# Patient Record
Sex: Male | Born: 1975 | Race: Black or African American | Hispanic: No | Marital: Married | Smoking: Never smoker
Health system: Southern US, Community
[De-identification: ages and names within clinical notes are randomized; demographics above are authoritative.]

## PROBLEM LIST (undated history)

## (undated) DIAGNOSIS — J45909 Unspecified asthma, uncomplicated: Secondary | ICD-10-CM

## (undated) HISTORY — PX: HERNIA REPAIR: SHX51

---

## 2004-08-07 ENCOUNTER — Emergency Department: Payer: Self-pay | Admitting: Emergency Medicine

## 2005-11-02 ENCOUNTER — Ambulatory Visit: Payer: Self-pay | Admitting: General Surgery

## 2006-07-21 ENCOUNTER — Ambulatory Visit: Payer: Self-pay | Admitting: Family Medicine

## 2006-07-21 IMAGING — CR DG CHEST 2V
1 series · 2 of 2 positions shown · non-contrast
Comparison: none

REASON FOR EXAM: xray chest cough wheezing hx: asthma call report
COMMENTS:

PROCEDURE:     DXR - DXR CHEST PA (OR AP) AND LATERAL  - [DATE]  [DATE]
RESULT:     PA and lateral views of the chest show the lung fields to be
clear.  The heart, mediastinal and osseous structures are normal in
appearance.

[Series 1: view not recorded · 0.17mm/px · 2 of 2 slices shown]
[im 1/2]
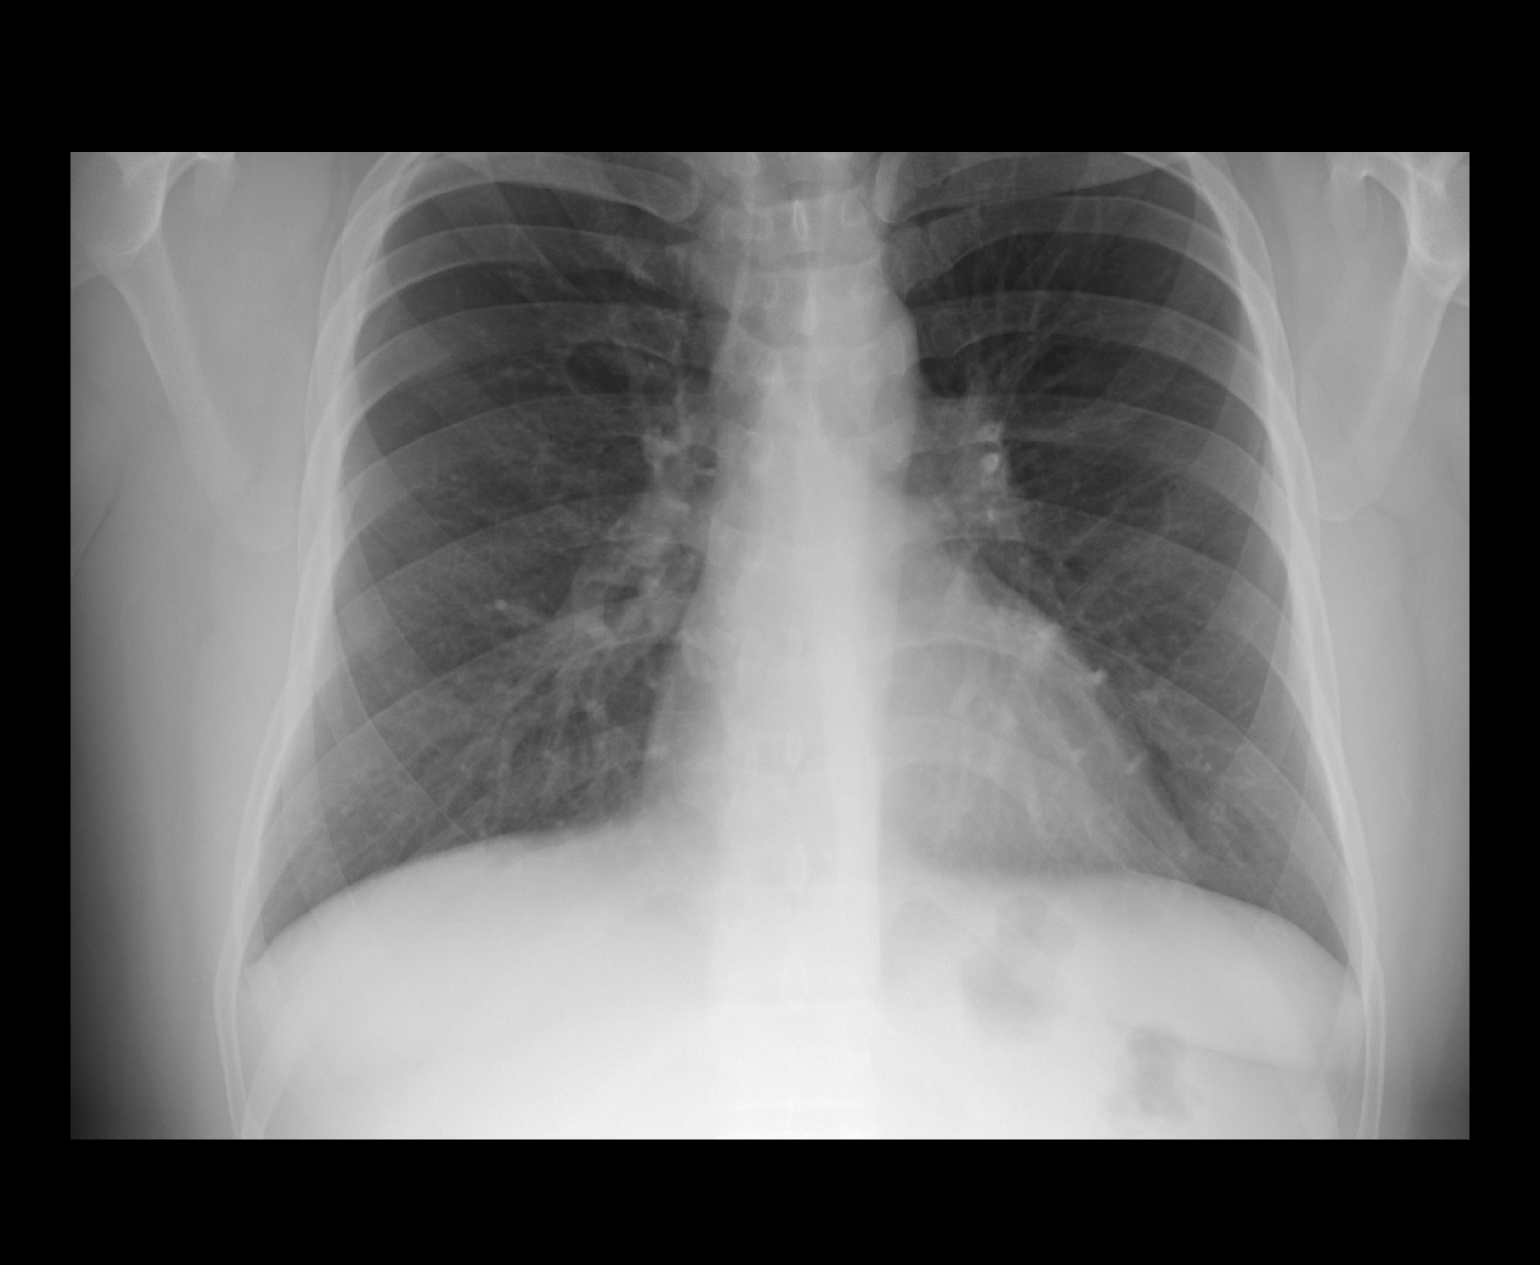
[im 2/2]
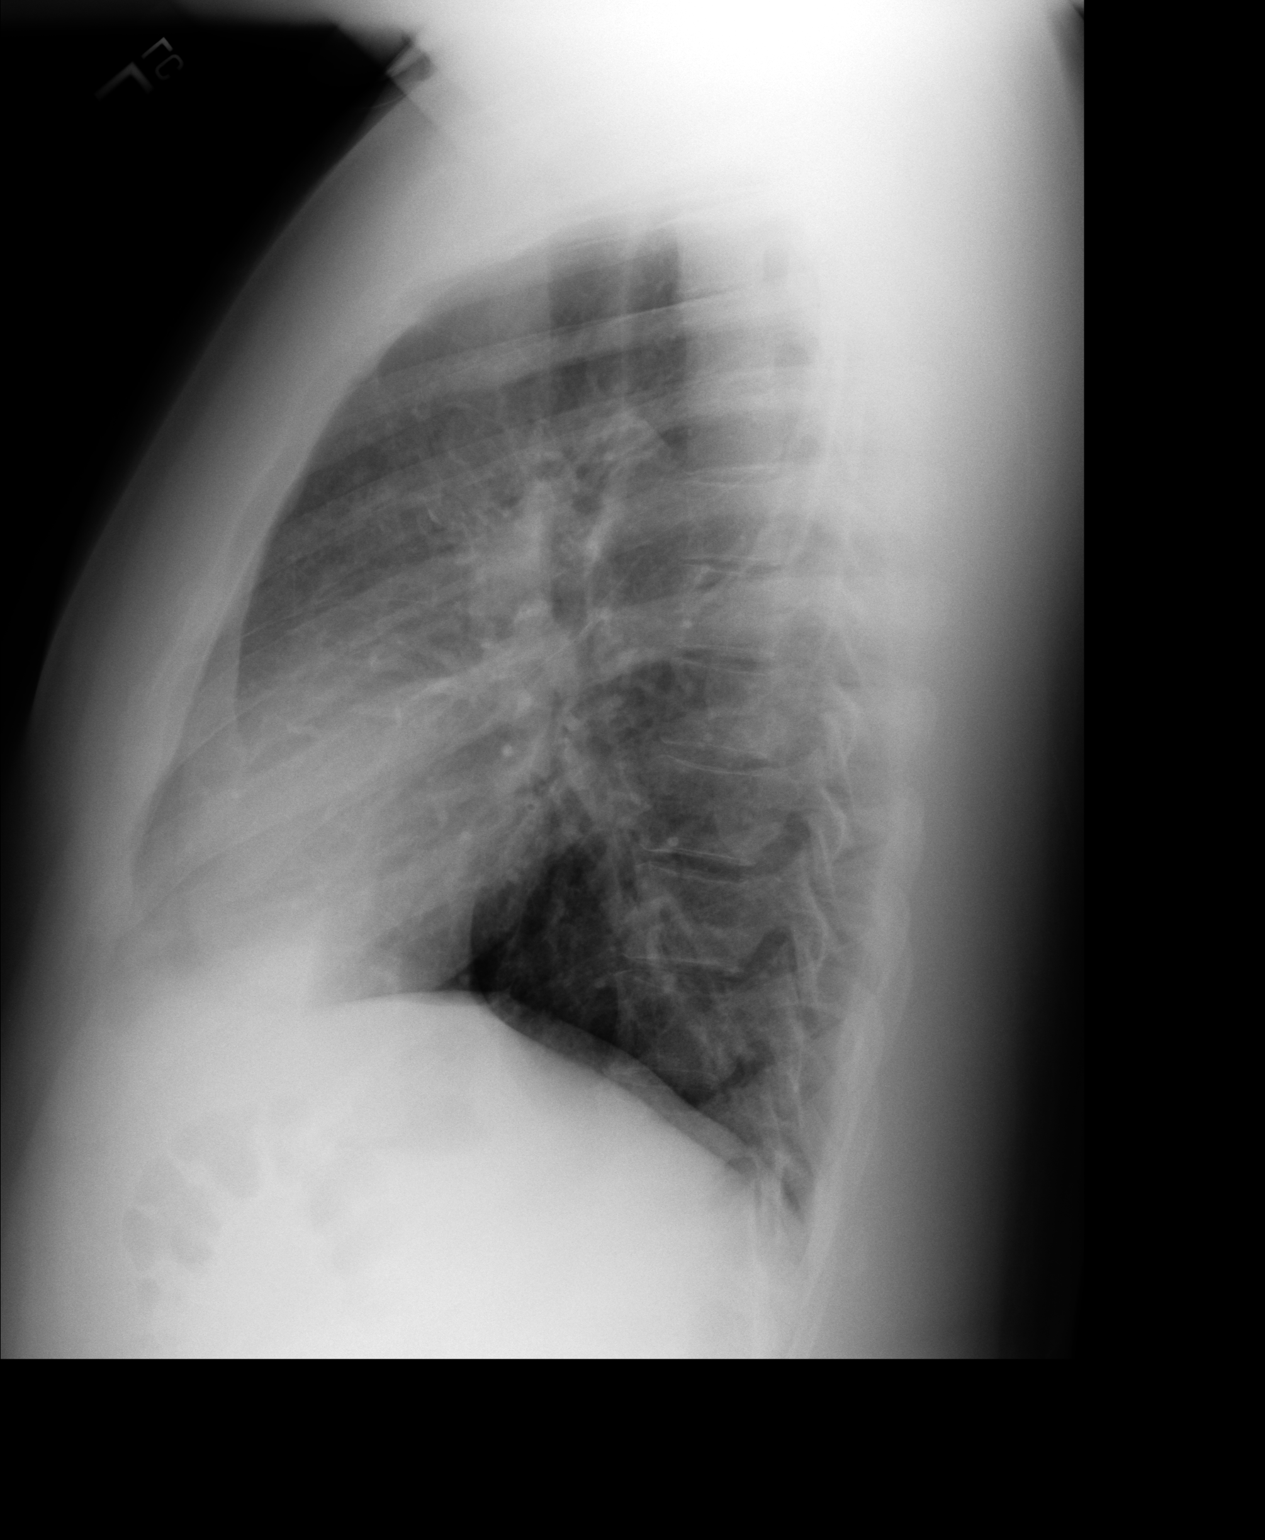

[2 of 2 positions shown; findings below may reference images not displayed]

IMPRESSION: No acute changes are identified.

## 2006-10-01 ENCOUNTER — Emergency Department: Payer: Self-pay | Admitting: Emergency Medicine

## 2008-02-13 ENCOUNTER — Emergency Department: Payer: Self-pay | Admitting: Emergency Medicine

## 2008-11-14 ENCOUNTER — Emergency Department: Payer: Self-pay | Admitting: Emergency Medicine
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
## 2010-05-22 ENCOUNTER — Emergency Department: Payer: Self-pay | Admitting: Internal Medicine

## 2012-02-25 ENCOUNTER — Inpatient Hospital Stay: Payer: Self-pay | Admitting: Internal Medicine

## 2012-02-25 LAB — BASIC METABOLIC PANEL
Anion Gap: 5 — ABNORMAL LOW (ref 7–16)
Calcium, Total: 9.1 mg/dL (ref 8.5–10.1)
Chloride: 103 mmol/L (ref 98–107)
EGFR (African American): >60
EGFR (Non-African Amer.): >60
Glucose: 132 mg/dL — ABNORMAL HIGH (ref 65–99)
Osmolality: 276 (ref 275–301)
Potassium: 3.5 mmol/L (ref 3.5–5.1)
Sodium: 138 mmol/L (ref 136–145)

## 2012-02-25 LAB — TROPONIN I: Troponin-I: <0.02 ng/mL

## 2012-02-25 LAB — CBC
HCT: 42.4 % (ref 40.0–52.0)
HGB: 13.7 g/dL (ref 13.0–18.0)
MCH: 28.2 pg (ref 26.0–34.0)
MCHC: 32.3 g/dL (ref 32.0–36.0)
Platelet: 333 10*3/uL (ref 150–440)
WBC: 13.1 10*3/uL — ABNORMAL HIGH (ref 3.8–10.6)

## 2012-02-25 IMAGING — CR DG CHEST 2V
1 series · 2 of 2 positions shown · non-contrast
Comparison: none

REASON FOR EXAM: Shortness of breath, chest pain
COMMENTS:

PROCEDURE:     DXR - DXR CHEST PA (OR AP) AND LATERAL  - [DATE]  [DATE]
RESULT:     The lungs are clear. The heart and pulmonary vessels are normal.
The bony and mediastinal structures are unremarkable. There is no effusion.
There is no pneumothorax or evidence of congestive failure.

[Series 1: w chest pa · 0.14mm/px · 2 of 2 slices shown]
[im 1/2]
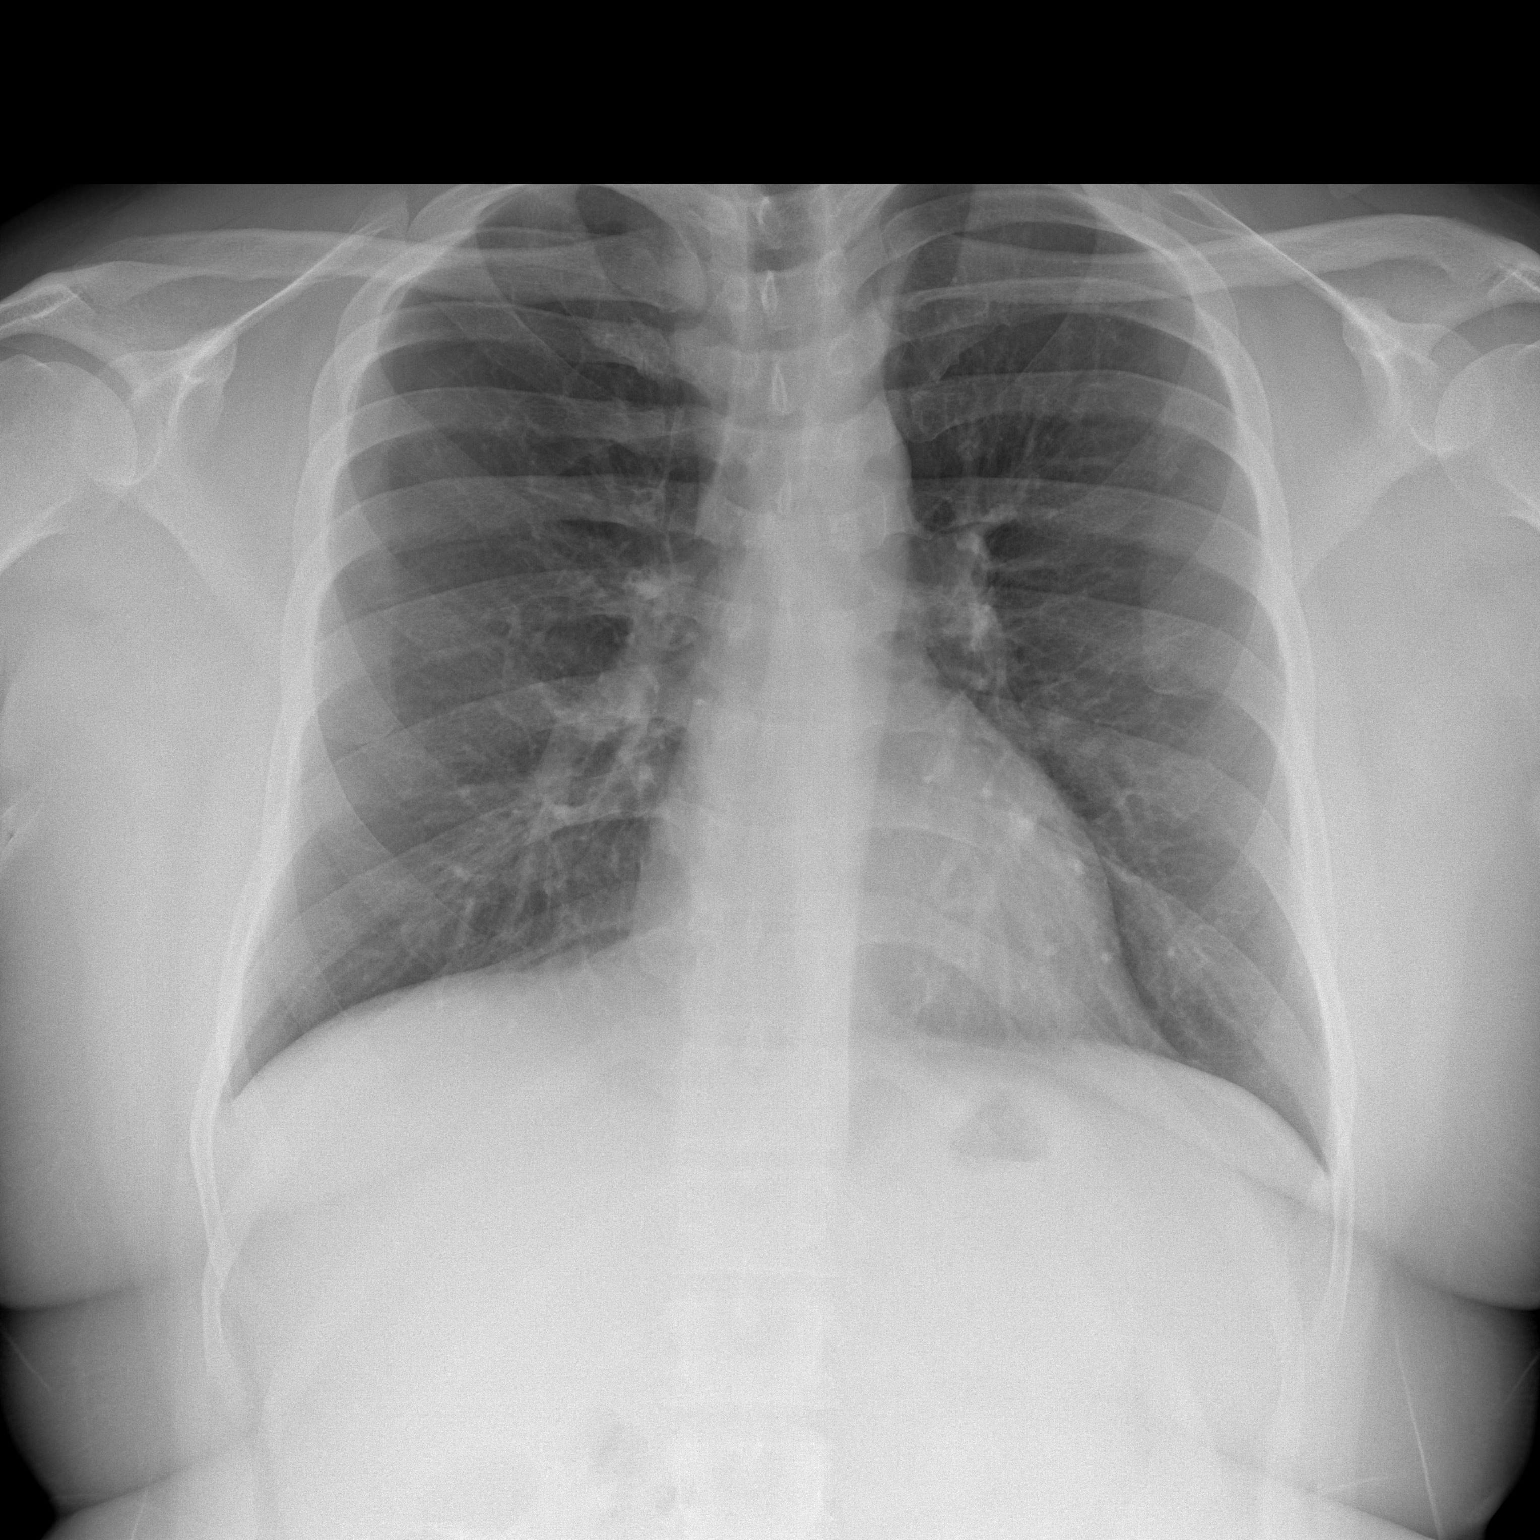
[im 2/2]
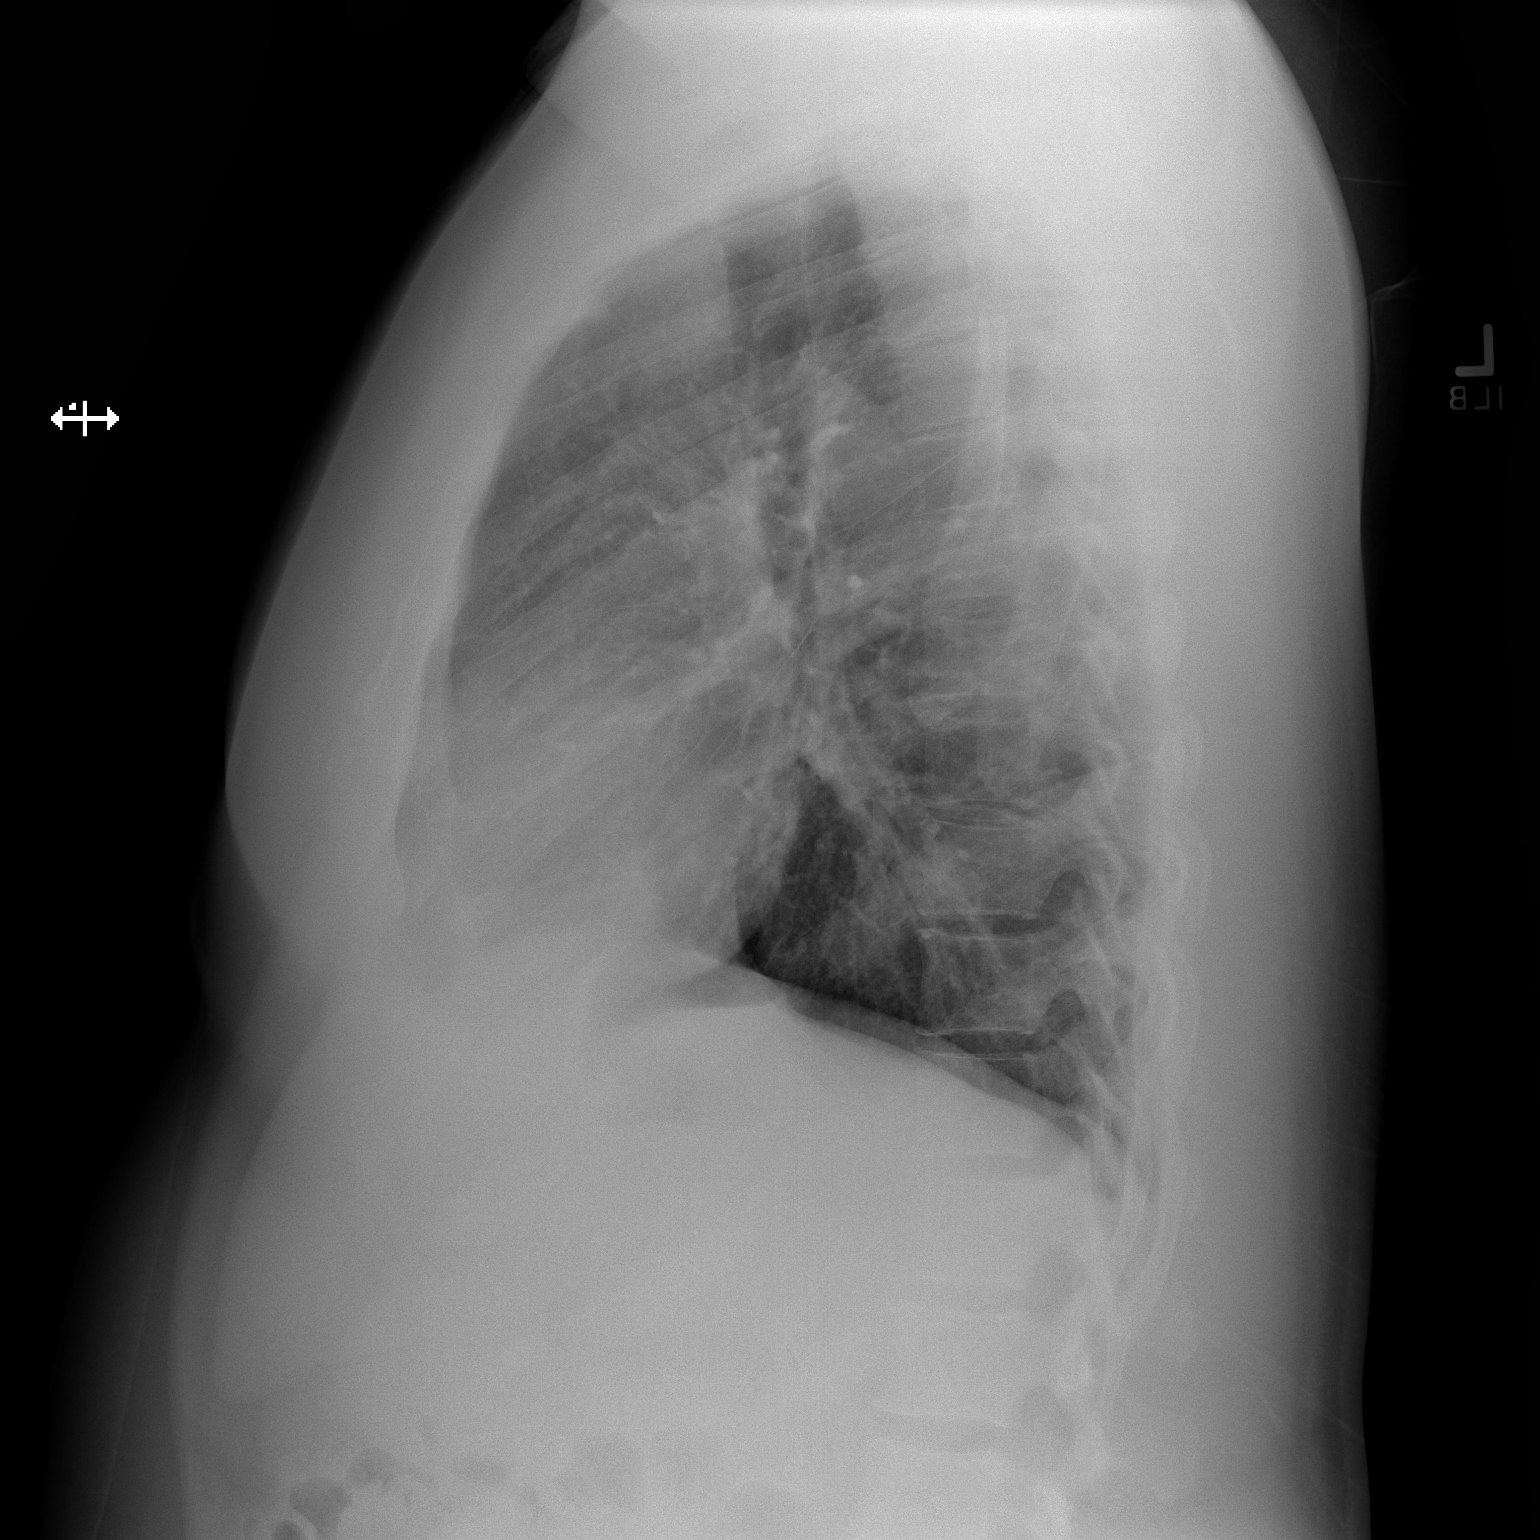

[2 of 2 positions shown; findings below may reference images not displayed]

IMPRESSION: No acute cardiopulmonary disease.

[REDACTED]

## 2012-02-26 LAB — BASIC METABOLIC PANEL
Anion Gap: 10 (ref 7–16)
BUN: 10 mg/dL (ref 7–18)
Chloride: 103 mmol/L (ref 98–107)
Creatinine: 1.16 mg/dL (ref 0.60–1.30)
EGFR (African American): >60
Glucose: 152 mg/dL — ABNORMAL HIGH (ref 65–99)
Osmolality: 280 (ref 275–301)
Sodium: 139 mmol/L (ref 136–145)

## 2012-02-26 LAB — CBC WITH DIFFERENTIAL/PLATELET
Basophil #: 0 10*3/uL (ref 0.0–0.1)
Basophil %: 0.2 %
Eosinophil #: 0 10*3/uL (ref 0.0–0.7)
HGB: 12.6 g/dL — ABNORMAL LOW (ref 13.0–18.0)
Lymphocyte %: 11.7 %
MCH: 27 pg (ref 26.0–34.0)
MCV: 87 fL (ref 80–100)
Neutrophil %: 86.9 %
Platelet: 341 10*3/uL (ref 150–440)
RBC: 4.67 10*6/uL (ref 4.40–5.90)

## 2013-03-18 ENCOUNTER — Emergency Department: Payer: Self-pay | Admitting: Emergency Medicine

## 2013-08-31 ENCOUNTER — Emergency Department: Payer: Self-pay | Admitting: Emergency Medicine

## 2013-08-31 IMAGING — CR DG KNEE COMPLETE 4+V*R*
1 series · 4 of 4 positions shown · non-contrast
Comparison: None.

CLINICAL DATA: Pain post trauma

EXAM:
RIGHT KNEE - COMPLETE 4+ VIEW

[Series 1: t knee ap right · 0.14mm/px · 4 of 4 slices shown]
[im 1/4]
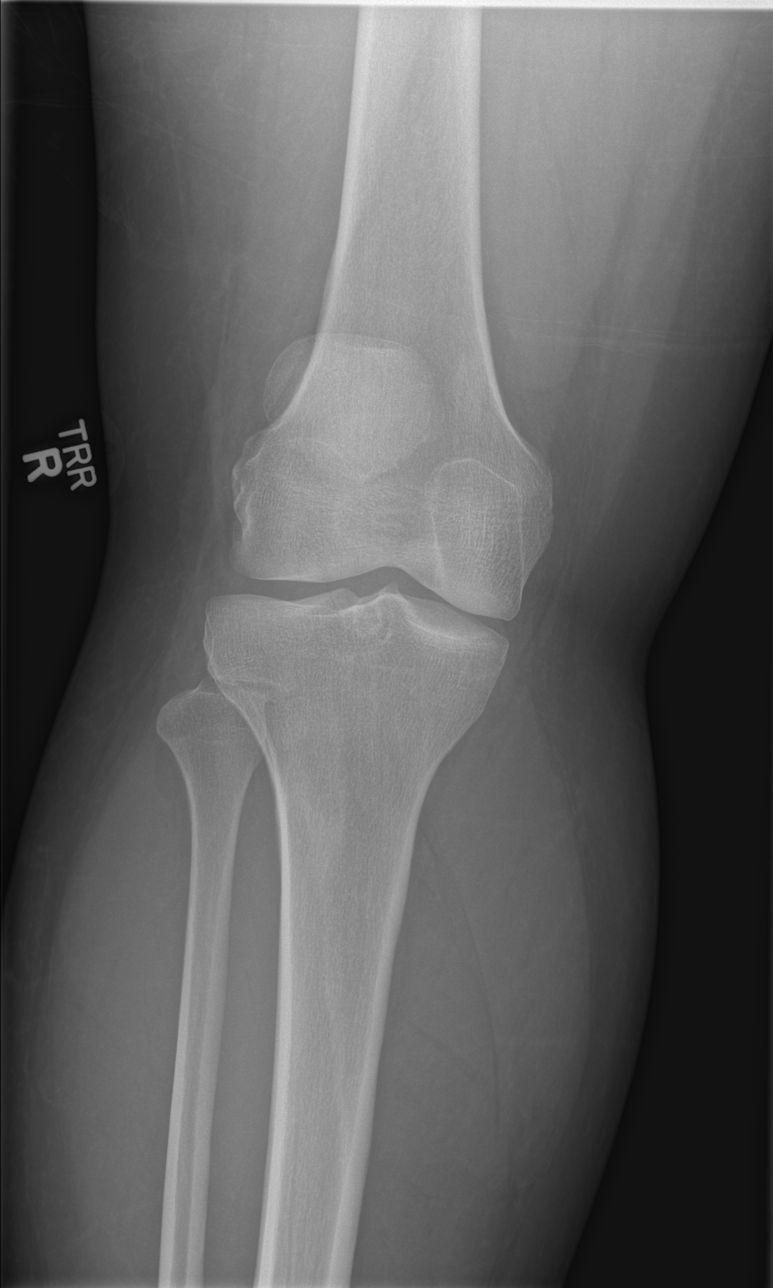
[im 2/4]
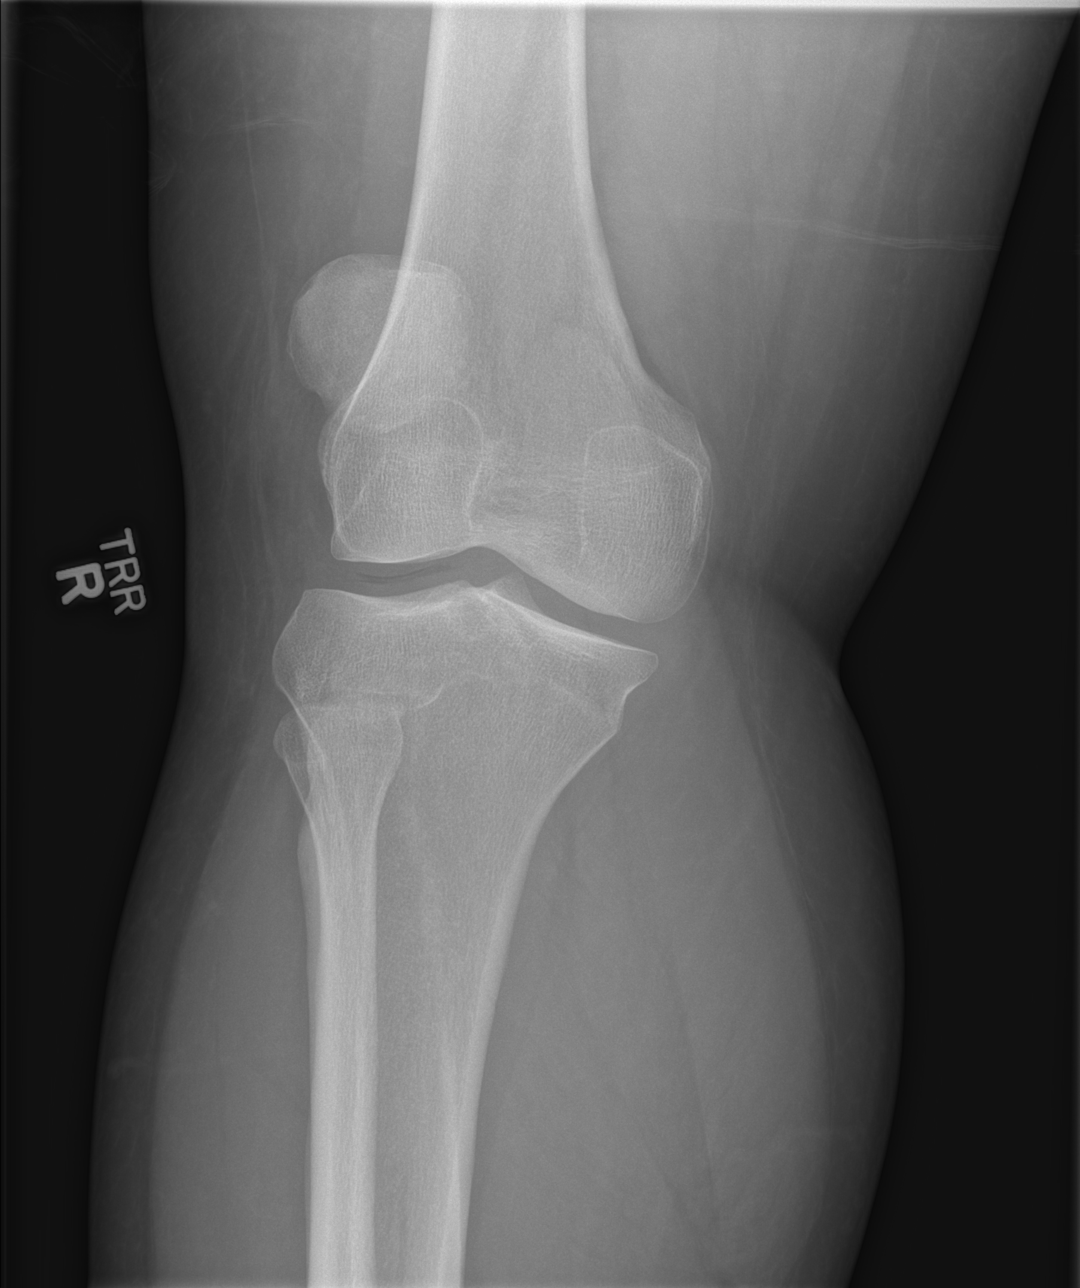
[im 3/4]
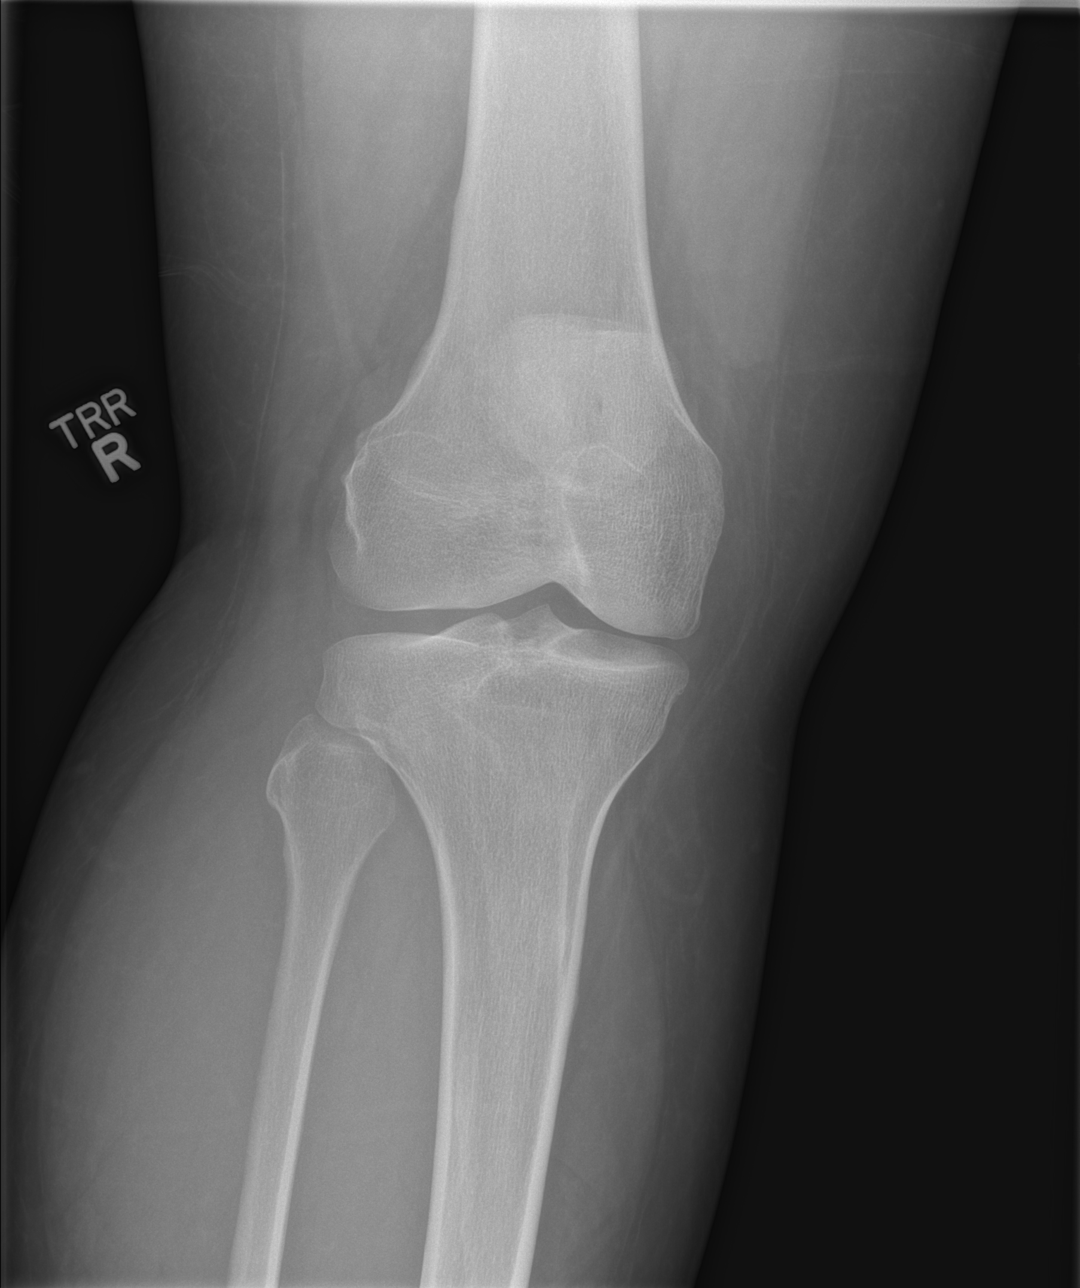
[im 4/4]
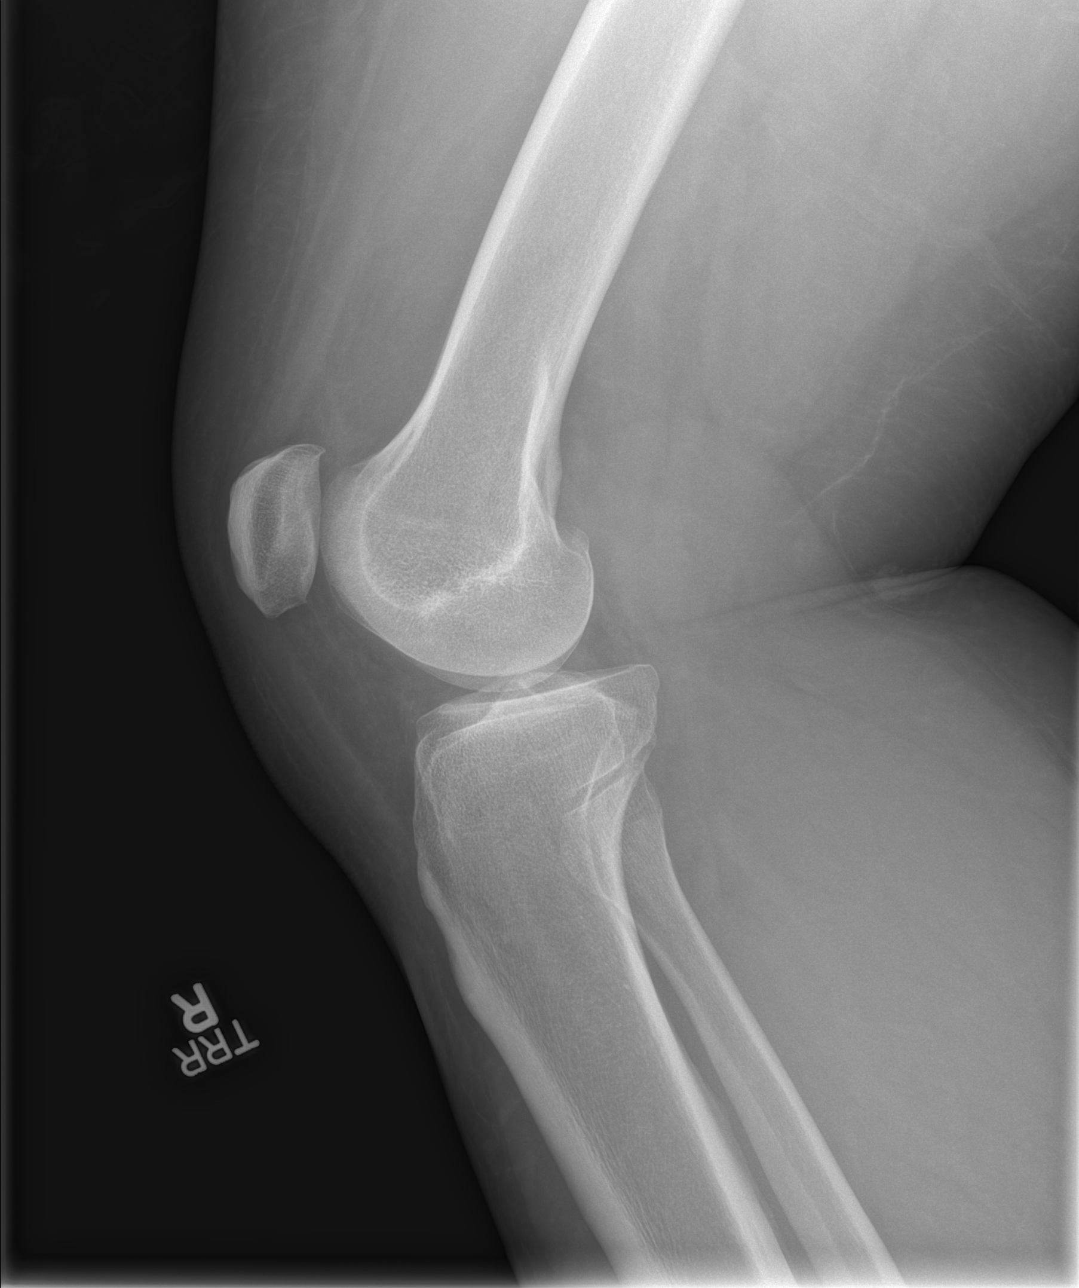

[4 of 4 positions shown; findings below may reference images not displayed]

FINDINGS: Frontal, lateral, and bilateral oblique views were obtained. There
is no fracture, dislocation, or effusion. Joint spaces appear
intact. No erosive change.
IMPRESSION: No abnormality noted.

## 2013-08-31 IMAGING — CR DG LUMBAR SPINE 2-3V
1 series · 3 of 3 positions shown · non-contrast
Comparison: None.

CLINICAL DATA: Pain post trauma

EXAM:
LUMBAR SPINE - 2-3 VIEW

[Series 1: t lumbar spine ap · 0.14mm/px · 3 of 3 slices shown]
[im 1/3]
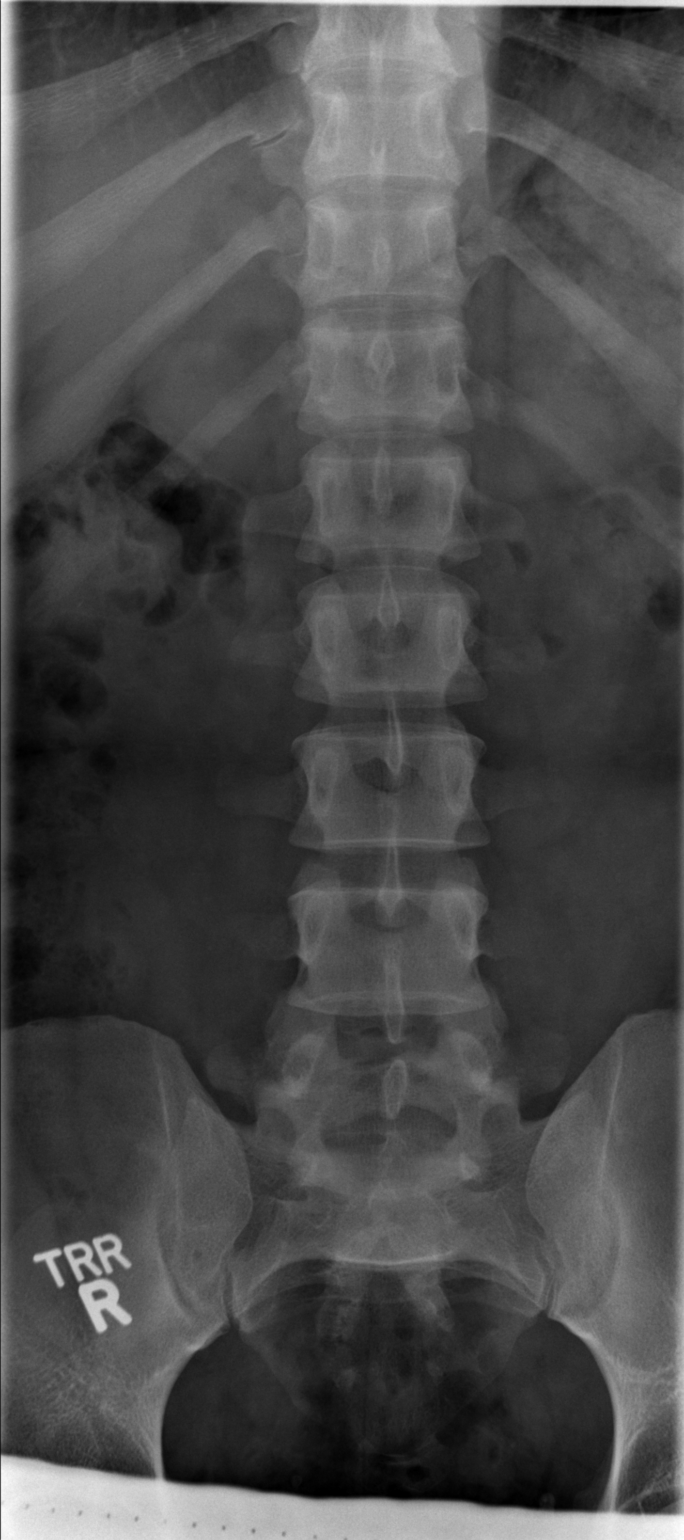
[im 2/3]
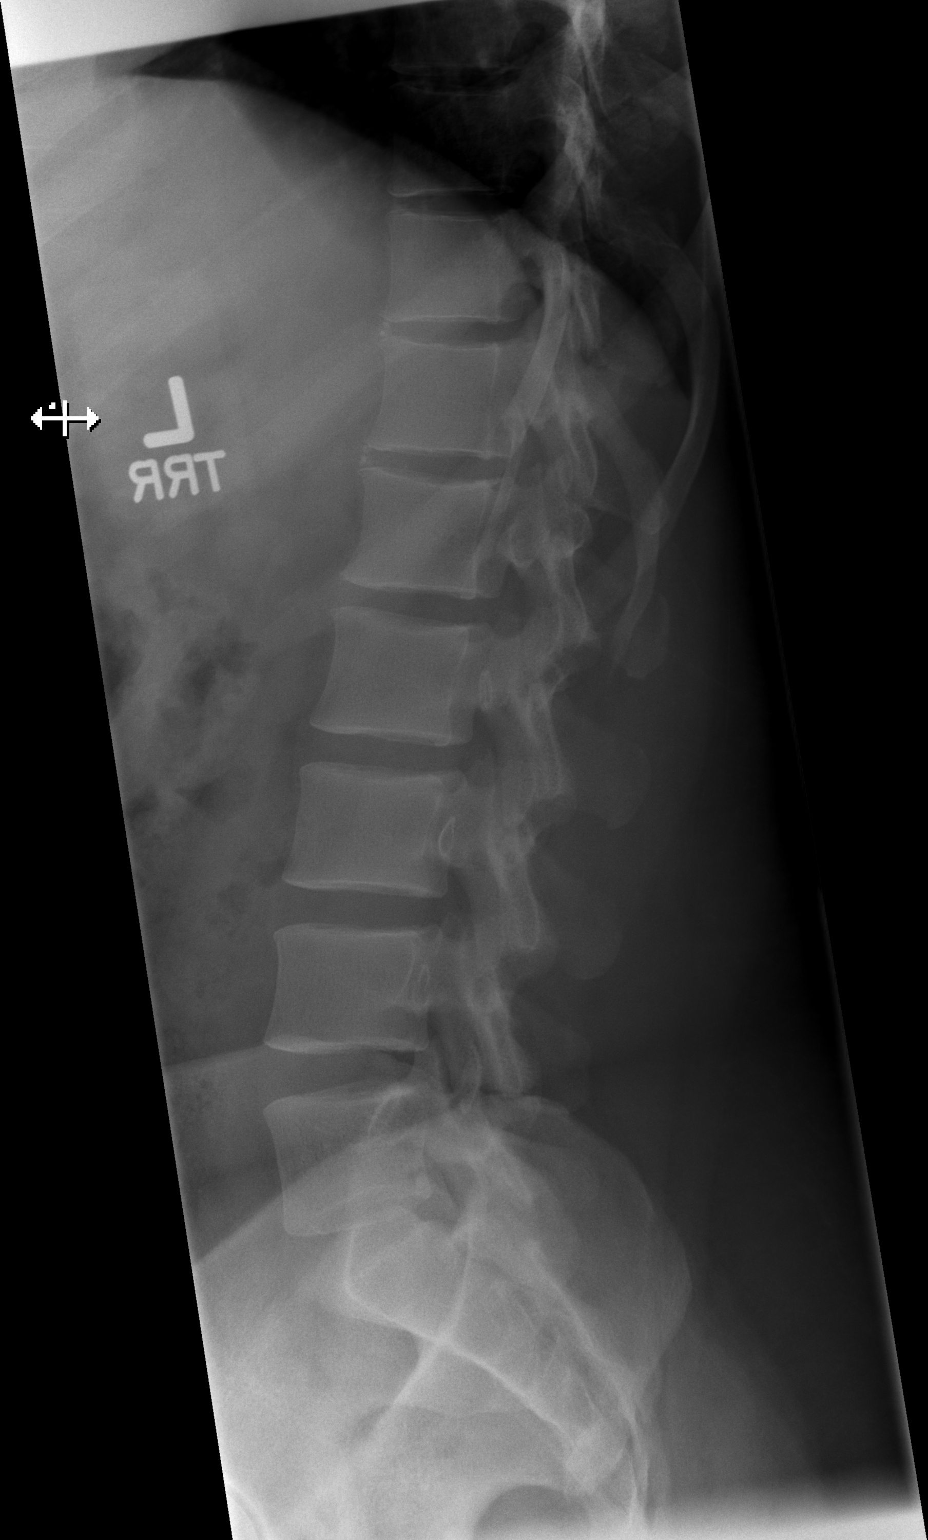
[im 3/3]
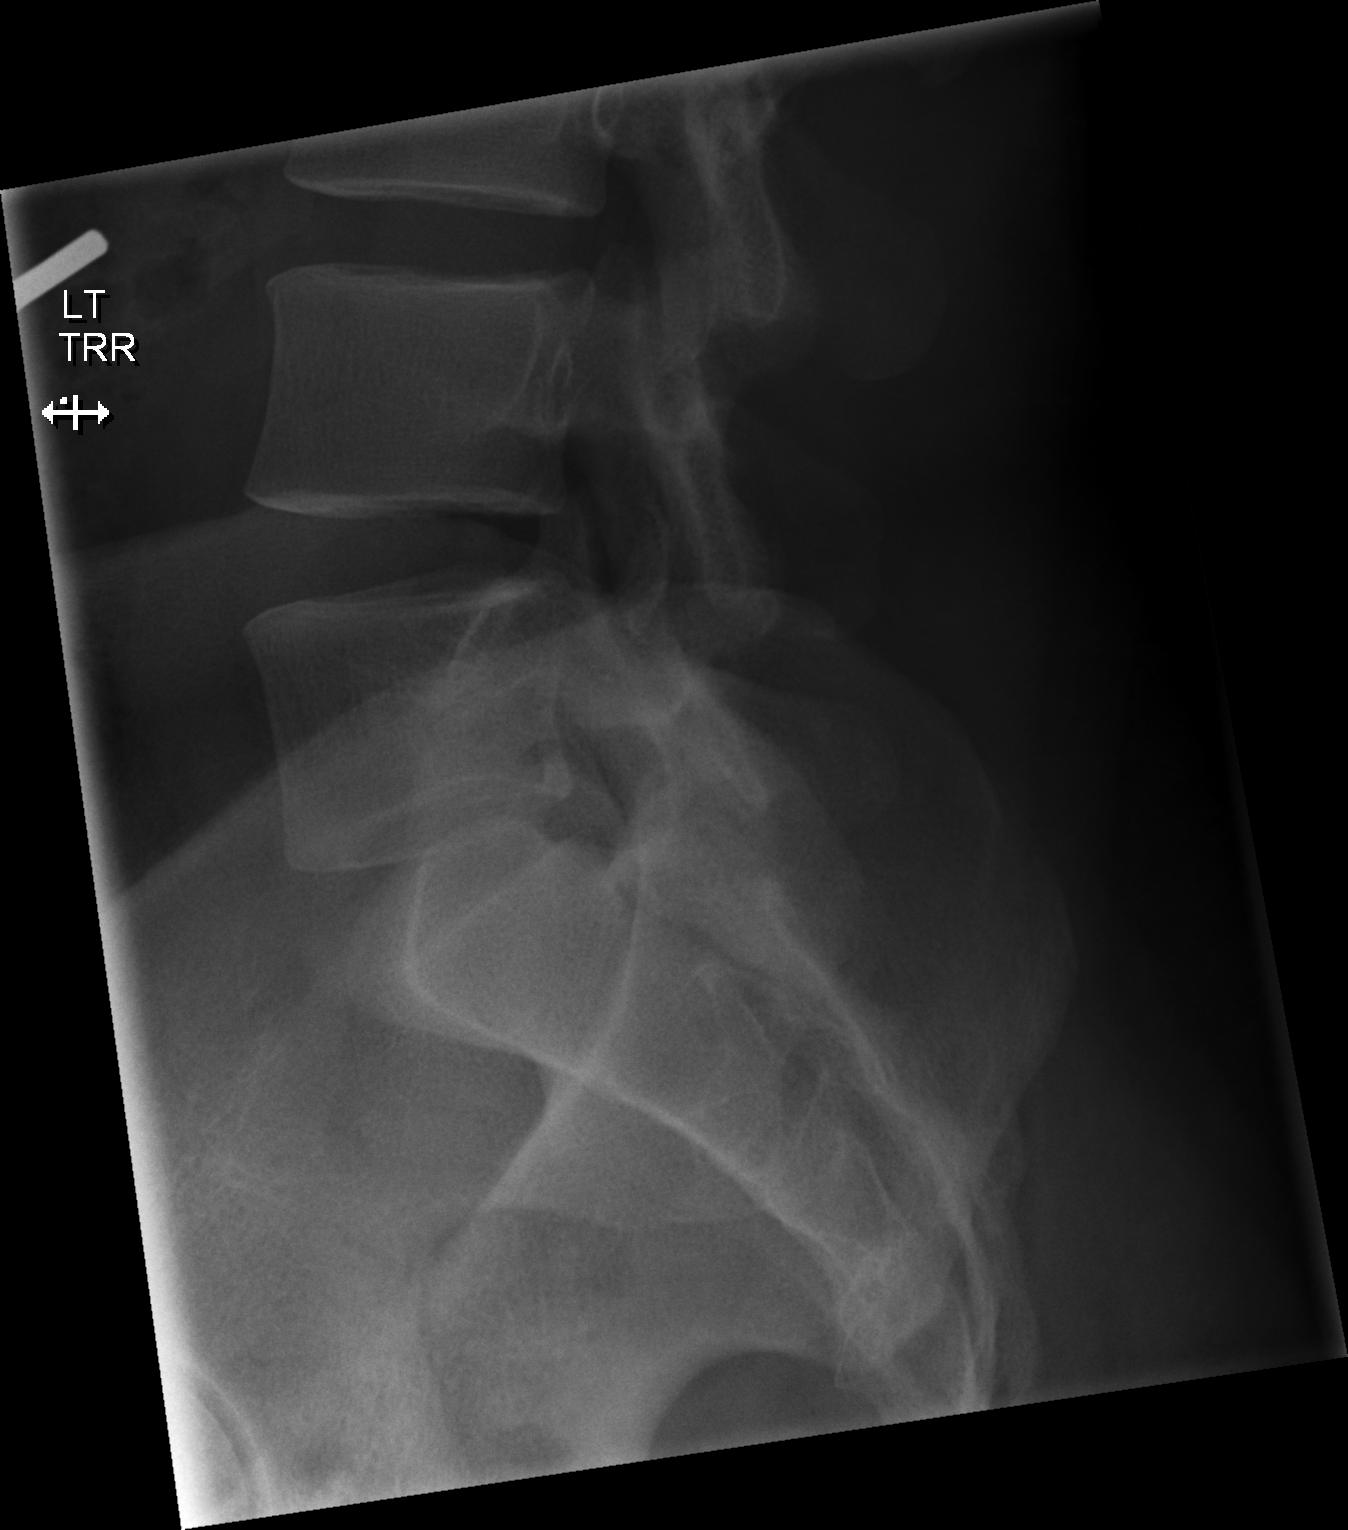

[3 of 3 positions shown; findings below may reference images not displayed]

FINDINGS: Frontal, lateral, and spot lumbosacral lateral images were obtained.
There are 5 non-rib-bearing lumbar type vertebral bodies. There is
no fracture or spondylolisthesis. Disc spaces appear intact.
IMPRESSION: No fracture or appreciable arthropathy.
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
## 2013-09-14 ENCOUNTER — Emergency Department: Payer: Self-pay | Admitting: Emergency Medicine

## 2013-12-15 ENCOUNTER — Emergency Department: Payer: Self-pay | Admitting: Emergency Medicine

## 2014-03-22 ENCOUNTER — Emergency Department: Payer: Self-pay | Admitting: Emergency Medicine

## 2014-06-08 NOTE — Discharge Summary (Signed)
PATIENT NAMHarrie Foreman:  [name], Mark [name] MR#:  [id number] DATE OF BIRTH:  1975/04/08  DATE OF ADMISSION:  02/25/2012 DATE OF DISCHARGE:  02/26/2012  ADMITTING PHYSICIAN:  Dr. Mordecai MaesSanchez.  DISCHARGING PHYSICIAN:  Dr. Enid Baasadhika Marcena Dias.   PRIMARY CARE PHYSICIAN:  None.  CONSULTATIONS IN THE HOSPITAL:  None.   DISCHARGE DIAGNOSES:  Acute asthma exacerbation.   DISCHARGE MEDICATIONS: 1.  Prednisone taper 60 mg by mouth daily and taper off by 10 mg every day.  2.  Albuterol inhaler 2 puffs 4 times a day as needed, patient already had.  3.  Albuterol nebulizer 2.5 mg/3 mL solution 3 mL q. 6 hours p.r.n. for wheezing and shortness of breath.  4.  Advair 250/50 1 puff twice daily.    DISCHARGE DIET:  Regular diet.   DISCHARGE ACTIVITY:  As tolerated.   FOLLOWUP INSTRUCTIONS:  PCP follow-up in 3 to 4 weeks.  His appointment is being set up with [facility] for 03/22/2012 at 5:00 p.m.   BRIEF HOSPITAL COURSE:  The patient is a 39 year old African American male with known history of asthma, admitted for acute asthma exacerbation.    Acute asthma exacerbation.  The patient has been doing well at home with no recent exacerbation or flare-ups or recent hospitalizations or being on steroids.  He just has a nebulizer machine at home and uses it as needed.  He thinks probably the weather change is what brought this episode on.  He tried using his nebulizer without any success so came to the hospital.  He was diffusely wheezing on admission, was placed on IV steroids and also an Advair inhaler.  His lungs are clear now and he is ambulating well on room air and not appears to be in respiratory distress.  So, he is being tapered to oral steroids, albuterol inhaler and Advair to be taken at home.  Care management is helping with his medications.  His course has been otherwise uneventful in the hospital.   DISCHARGE CONDITION:  Stable.   DISCHARGE DISPOSITION:  Home.   TIME SPENT ON DISCHARGE:  Forty-five minutes.      ____________________________ Enid Baasadhika Jerri Hargadon, MD rk:ea [name]: 02/26/2012 13:49:55 ET T: 02/27/2012 06:40:59 ET JOB#: [id number]  cc: Enid Baasadhika Bita Cartwright, MD, <Dictator> [facility] Enid BaasADHIKA Marci Polito MD ELECTRONICALLY SIGNED 02/29/2012 15:20

## 2014-06-08 NOTE — H&P (Signed)
PATIENT NAMHarrie [name]:  [name], [name] MR#:  [id number] DATE OF BIRTH:  [birth year]  DATE OF ADMISSION:  02/25/2012  CHIEF COMPLAINT:  Shortness of breath and wheezing.   PRIMARY CARE PHYSICIAN:  The patient does not know his name. He has not seen him in over a year.   REFERRING PHYSICIAN:  Dorothea GlassmanPaul Malinda, MD  HISTORY OF PRESENT ILLNESS:  The patient is a very nice 39 year old gentleman with a history of severe asthma. The patient usually uses his inhaler and nebulizers quite often several times a week and recently with the change in the weather he became really short of breath and this morning around 3 a.m. started wheezing really hard and started using nebulizer treatments pretty much every 2 to 3 hours without any significant improvement for which he came to the ER to get evaluated. Here in the ER, she got 60 mg of prednisone and at least 3 or 4 nebulizer treatments without any significant improvement. He is not wheezing at this moment, but he is not moving much air. He is on room air, but does not seem to be able to tolerate much activity without getting short of breath. The patient has been previously intubated once at the age of 39 and has been in the hospital on multiple occasions due to his asthma exacerbation. He denies any upper respiratory infection. There are no sick contacts at home that he can tell. He denies any significant infection lately. He states that whenever the weather changes drastically to very cold, the patient gets his exacerbations this way as well. It happens whenever he is really stressed and he has been really stressed since he was laid off work in February.   REVIEW OF SYSTEMS:   CONSTITUTIONAL: No fever. No fatigue. No weakness. No weight loss or weight gain.  EYES: No blurry vision. No double vision. No cataracts.  ENT: No tinnitus. No difficulty swallowing. No nasal dripping. No signs of upper respiratory infection.  RESPIRATORY: No tuberculosis. No pneumonia. He has been  coughing, wheezing and having significant dyspnea especially with minimal exertion for the past day.  CARDIOVASCULAR: No chest pain. No orthopnea. No syncope.  GASTROINTESTINAL: No nausea, vomiting, abdominal pain, constipation or diarrhea.  GENITOURINARY: No dysuria, hematuria or changes in frequency.  ENDOCRINE: No polyuria, polydipsia or polyphagia. No cold or heat intolerance.  HEMATOLOGIC/LYMPHATICS: No anemia, easy bruising or swollen glands.  MUSCULOSKELETAL: No significant back pain, shoulder pain, knee pain or gout. No swelling of joints.  NEUROLOGIC: No numbness. No tingling. No significant weakness or ataxia.  PSYCHIATRIC: The patient is stressed out due to his job situation. Does not have any significant depression or anxiety.   PAST MEDICAL HISTORY:  History of asthma.   ALLERGIES:  No known drug allergies.   PAST SURGICAL HISTORY:  The patient has had an inguinal hernia repair.   MEDICATIONS:  ProAir and nebulizer at home.   SOCIAL HISTORY:  The patient has been out of work since February. 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 He does not smoke and does not drink. He has 3 children and his wife lives with him.   FAMILY HISTORY:  His mother and his sister have severe asthma. His grandmother had diabetes, hypertension and coronary artery disease.   PHYSICAL EXAMINATION: VITAL SIGNS: Blood pressure 148/62, pulse 89, respiratory rate 20, temperature 98.3.  GENERAL: The patient is alert and oriented x 3 in no acute distress and very pleasant. No respiratory distress. Hemodynamically stable.  HEENT: Pupils are equal and reactive. Extraocular movements are intact. Mucosa is moist. No oral lesions. No oropharyngeal exudates. No thrush.  NECK: Supple. No JVD. No thyromegaly. No adenopathy. No carotid bruits. No rigidity.  CARDIOVASCULAR: Regular rate and rhythm. No murmurs, rubs, or gallops.  PULMONARY: At this moment, he is not wheezing. He has mild  rhonchi. He is not moving much air. No good expansion of the bilateral lung fields. No dullness to percussion. No use of accessory muscles.   ABDOMEN: Soft, nontender, nondistended. No hepatosplenomegaly. No masses. Bowel sounds are positive.  GENITAL: Deferred.  EXTREMITIES: No edema, no cyanosis, no clubbing. Pulses +2.  NEUROLOGIC: Cranial nerves II through XII are intact. No focal findings.  PSYCHIATRIC: No anxiety or evident agitation.  SKIN: Without any rashes or petechiae. No signs of eczema.  MUSCULOSKELETAL: No significant joint abnormality or joint effusions.  LYMPHATIC: Negative for lymphadenopathy in the neck or supraclavicular areas.   DIAGNOSTIC DATA:  Glucose is 132, creatinine 1.18, CO2 of 30, total CK 460, troponin 0.02. White count 13,000, hemoglobin is 13.7, platelets 333. Chest x-ray: No significant exudates or signs of pneumonia. No CHF.   ASSESSMENT AND PLAN:  A 39 year old gentleman with history of asthma which has been very severe to the point that he has been intubated once at the age of 72. He comes with an asthma exacerbation.  1.  Acute asthma exacerbation. The patient has triggered his symptoms due to changes in the weather. The patient states that he is not having any significant infectious symptoms like a viral or bacterial infection. No significant sputum. No significant rhinorrhea or sore throat. No fevers. No sick contacts. The patient is admitted for treatment of his exacerbation due to failure of outpatient treatment. The patient had prednisone and several nebulizers given to him here without any success. The patient is still not moving enough air. His wheezing has improved, though. The patient is going to be put on intravenous steroids at high dose and monitor his improvement. He is going to be on nebulizers. He is not able to afford his medications for which we are going to give him his inhalers. We are going to add on Advair since the patient needs it and I am  going to add on also Singulair.  2.  I am going to check his magnesium and if his magnesium is low or normal low, we are going to give him 2 grams.  3.  The patient is a full code.  4.  He does not have any other medical problems. His white count is slightly elevated at 13,000, but there is no evidence of being infection at this moment for which we are just going to observe and treat if necessary. No antibiotics were started at this moment.  5.  I discussed the case with the patient and his sister.   TIME SPENT:  I spent about 35 minutes with this admission.    ____________________________ Felipa Furnace, MD rsg:si D: 02/25/2012 21:48:00 ET T: 02/25/2012 23:40:53 ET JOB#: [id number]  cc: Felipa Furnace, MD, <Dictator> Treyvion Durkee Juanda Chance MD ELECTRONICALLY SIGNED 02/26/2012 13:51

## 2014-07-19 ENCOUNTER — Emergency Department
Admission: EM | Admit: 2014-07-19 | Discharge: 2014-07-19 | Disposition: A | Discharge status: Home / Self Care | Payer: Self-pay | Attending: Emergency Medicine | Admitting: Emergency Medicine

## 2014-07-19 ENCOUNTER — Encounter: Payer: Self-pay | Admitting: Emergency Medicine

## 2014-07-19 ENCOUNTER — Emergency Department: Payer: Self-pay

## 2014-07-19 DIAGNOSIS — R062 Wheezing: Secondary | ICD-10-CM

## 2014-07-19 DIAGNOSIS — J45901 Unspecified asthma with (acute) exacerbation: Secondary | ICD-10-CM | POA: Insufficient documentation

## 2014-07-19 DIAGNOSIS — J4521 Mild intermittent asthma with (acute) exacerbation: Secondary | ICD-10-CM

## 2014-07-19 HISTORY — DX: Unspecified asthma, uncomplicated: J45.909

## 2014-07-19 IMAGING — DX DG CHEST 2V
2 series · 2 of 2 positions shown · non-contrast
Comparison: [DATE]

CLINICAL DATA: Wheezing

EXAM:
CHEST  2 VIEW

[chest pa]
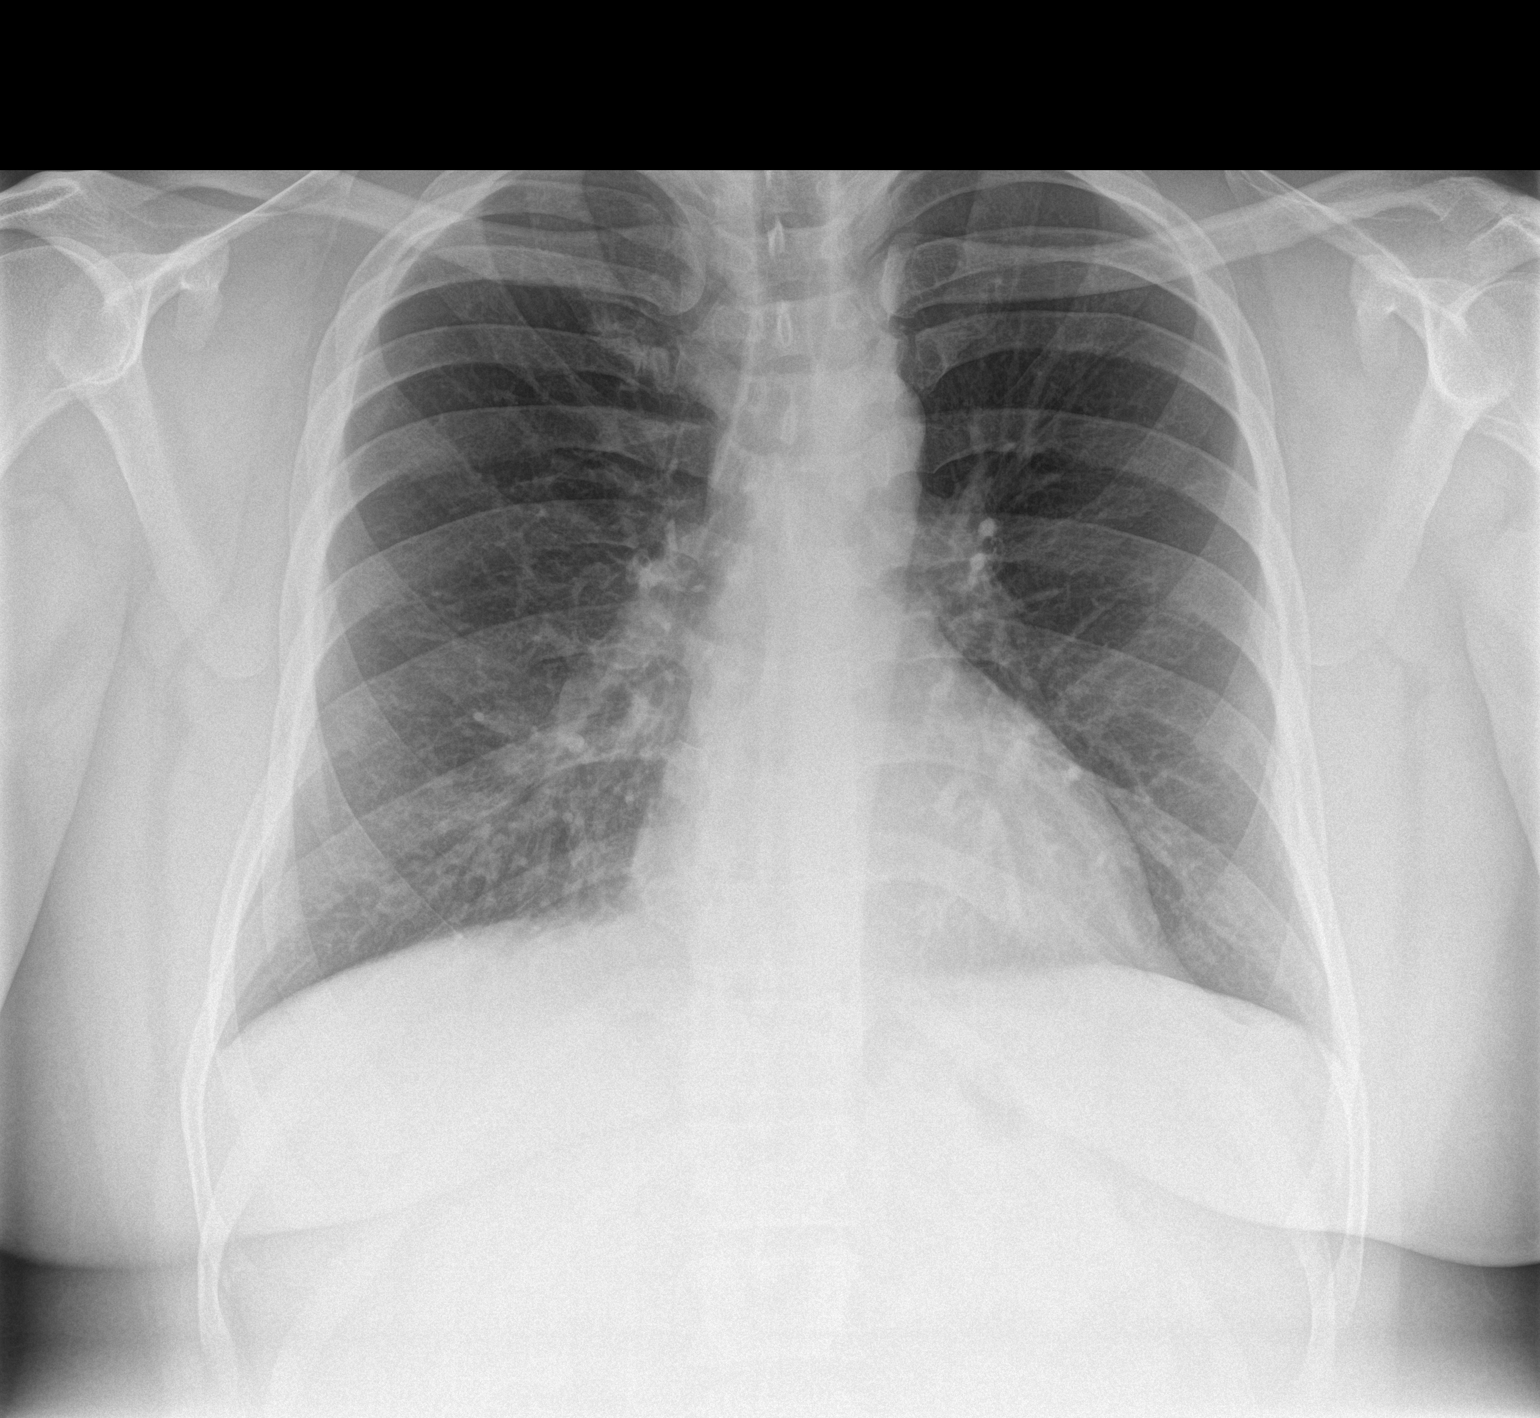

[chest lat]
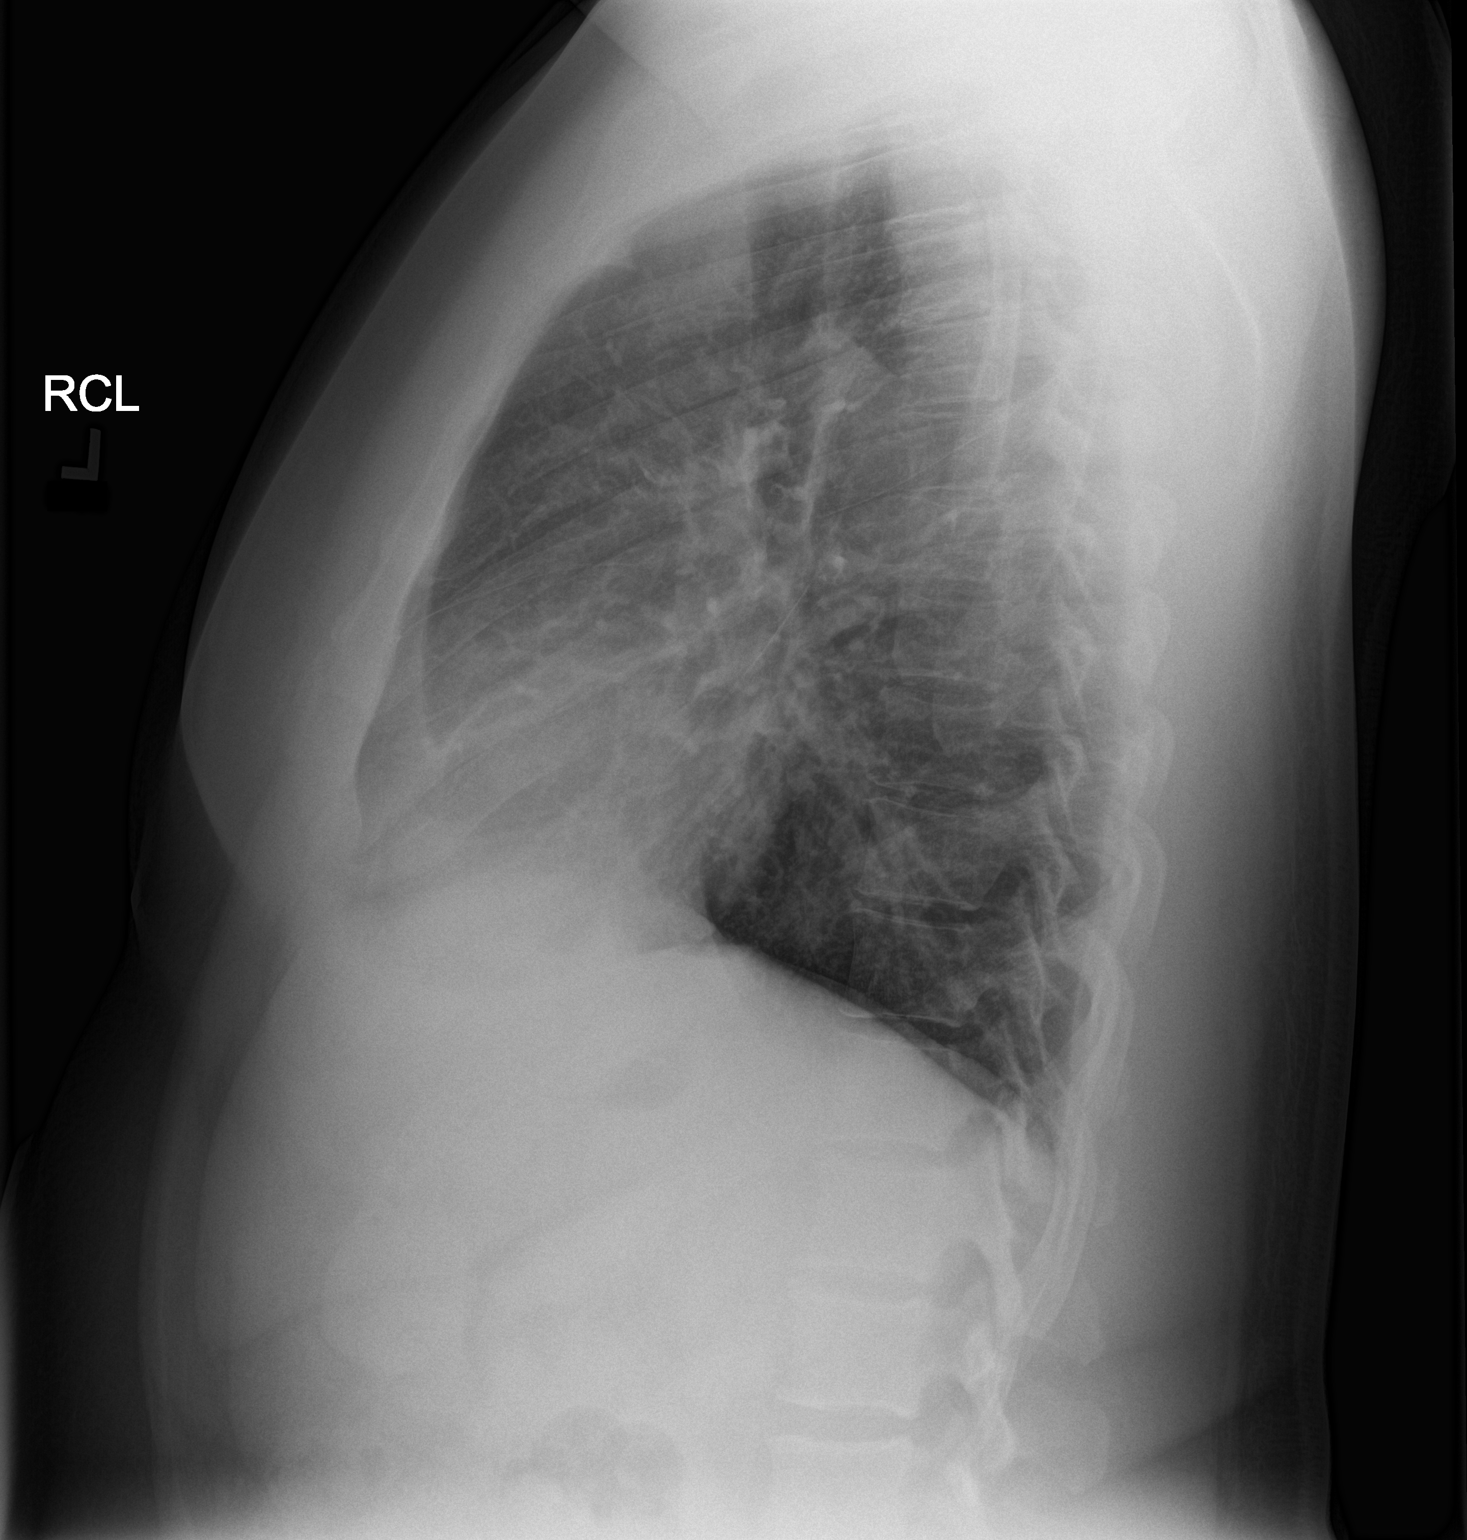

[2 of 2 positions shown; findings below may reference images not displayed]

FINDINGS: Cardiac shadow is within normal limits. The lungs are clear but
demonstrates some mild interstitial changes likely of a chronic
nature. No focal confluent infiltrate is seen no bony abnormality is
noted.
IMPRESSION: No active cardiopulmonary disease.

## 2014-07-19 MED ORDER — ALBUTEROL SULFATE (2.5 MG/3ML) 0.083% IN NEBU
2.5000 mg | INHALATION_SOLUTION | Freq: Once | RESPIRATORY_TRACT | Status: AC
  Administered 2014-07-19: 2.5 mg via RESPIRATORY_TRACT

## 2014-07-19 MED ORDER — PREDNISONE 10 MG PO TABS: ORAL_TABLET | ORAL | Status: DC

## 2014-07-19 MED ORDER — ALBUTEROL SULFATE HFA 108 (90 BASE) MCG/ACT IN AERS: 2.0000 | INHALATION_SPRAY | Freq: Four times a day (QID) | RESPIRATORY_TRACT | Status: DC | PRN

## 2014-07-19 MED ORDER — AZITHROMYCIN 250 MG PO TABS: ORAL_TABLET | ORAL | Status: AC

## 2014-07-19 MED ORDER — METHYLPREDNISOLONE SODIUM SUCC 125 MG IJ SOLR
125.0000 mg | Freq: Once | INTRAMUSCULAR | Status: AC
  Administered 2014-07-19: 125 mg via INTRAMUSCULAR

## 2014-07-19 MED ORDER — METHYLPREDNISOLONE SODIUM SUCC 125 MG IJ SOLR
INTRAMUSCULAR | Status: AC
  Administered 2014-07-19: 125 mg via INTRAMUSCULAR
  Filled 2014-07-19: qty 2

## 2014-07-19 MED ORDER — ALBUTEROL SULFATE (2.5 MG/3ML) 0.083% IN NEBU
INHALATION_SOLUTION | RESPIRATORY_TRACT | Status: AC
  Administered 2014-07-19: 2.5 mg via RESPIRATORY_TRACT
  Filled 2014-07-19: qty 3

## 2014-07-19 NOTE — Discharge Instructions (Signed)

## 2014-07-19 NOTE — ED Notes (Signed)
Pt with shortness of breath and wheezzing. Pt hx of asthma.

## 2014-07-19 NOTE — ED Provider Notes (Signed)
CSN: [id number]     Arrival date & time 07/19/14  4098 History   First MD Initiated Contact with Patient 07/19/14 [phone]     Chief Complaint  Patient presents with  . Shortness of Breath    HPI Comments: 39 year old male presents today complaining of increased wheezing over the past several days. Patient has a long history of asthma with similar symptoms. He has been uninsured over the last year and has run out of his albuterol inhaler. He has been using his nebulizer with some relief. He reports that he went to [place] last weekend and since then he has had increased wheezing. He thinks the change in weather is what caused his problems. On Monday and Tuesday he did experience some fever and chills.  Patient is a 39 y.o. male presenting with shortness of breath. The history is provided by the patient.  Shortness of Breath Severity:  Mild Onset quality:  Gradual Timing:  Intermittent Progression:  Waxing and waning Chronicity:  Chronic Context: known allergens, URI and weather changes   Relieved by:  Inhaler Worsened by:  Coughing and weather changes Ineffective treatments:  Rest and inhaler Associated symptoms: cough, fever, sputum production, swollen glands and wheezing   Associated symptoms: no chest pain, no claudication, no diaphoresis and no rash   Risk factors: no family hx of DVT, no hx of cancer, no hx of PE/DVT, no recent surgery and no tobacco use     Past Medical History  Diagnosis Date  . Asthma    Past Surgical History  Procedure Laterality Date  . Hernia repair     No family history on file. History  Substance Use Topics  . Smoking status: Never Smoker   . Smokeless tobacco: Not on file  . Alcohol Use: No    Review of Systems  Unable to perform ROS Constitutional: Positive for fever. Negative for diaphoresis.  Respiratory: Positive for cough, sputum production, shortness of breath and wheezing. Negative for chest tightness and stridor.   Cardiovascular:  Negative for chest pain and claudication.  Musculoskeletal: Negative for myalgias.  Skin: Negative for rash.  All other systems reviewed and are negative.     Allergies  Review of patient's allergies indicates no known allergies.  Home Medications   Prior to Admission medications   Medication Sig Start Date End Date Taking? Authorizing Provider  albuterol (PROVENTIL HFA;VENTOLIN HFA) 108 (90 BASE) MCG/ACT inhaler Inhale 2 puffs into the lungs every 6 (six) hours as needed for wheezing or shortness of breath. 07/19/14   Luvenia Redden, PA-C  azithromycin (ZITHROMAX Z-PAK) 250 MG tablet Take 2 tablets (500 mg) on  Day 1,  followed by 1 tablet (250 mg) once daily on Days 2 through 5. 07/19/14 07/24/14  Luvenia Redden, PA-C  predniSONE (DELTASONE) 10 MG tablet Taper: 6, 5, 4, 3, 2, 1 07/19/14   Wilber Oliphant V, PA-C   BP 140/88 mmHg  Pulse 82  Temp(Src) 97.8 F (36.6 C) (Oral)  Resp 22  Ht  (1.753 m)  Wt 230 lb (104.327 kg)  BMI 33.95 kg/m2  SpO2 98% Physical Exam  Constitutional: He is oriented to person, place, and time. Vital signs are normal. He appears well-developed and well-nourished.  Non-toxic appearance. He does not have a sickly appearance. No distress.  HENT:  Head: Normocephalic and atraumatic.  Right Ear: Tympanic membrane and external ear normal.  Left Ear: Tympanic membrane and external ear normal.  Nose: Nose normal.  Mouth/Throat: Oropharynx is clear  and moist.  Eyes: Conjunctivae are normal. Pupils are equal, round, and reactive to light.  Neck: Normal range of motion. Neck supple.  Cardiovascular: Normal rate, regular rhythm, S1 normal, S2 normal, normal heart sounds and normal pulses.  Exam reveals no gallop and no friction rub.   No murmur heard. Pulmonary/Chest: Effort normal. No respiratory distress. He has wheezes. He has rales.  Musculoskeletal: Normal range of motion. He exhibits no edema or tenderness.  No calf tenderness or swelling  Lymphadenopathy:     He has cervical adenopathy.  Neurological: He is alert and oriented to person, place, and time.  Skin: Skin is warm and dry. No rash noted.  Psychiatric: He has a normal mood and affect. His behavior is normal. Judgment and thought content normal.  Nursing note and vitals reviewed.   ED Course  Procedures (including critical care time) Labs Review Labs Reviewed - No data to display  Imaging Review Dg Chest 2 View  07/19/2014   CLINICAL DATA:  Wheezing  EXAM: CHEST  2 VIEW  COMPARISON:  02/25/2012  FINDINGS: Cardiac shadow is within normal limits. The lungs are clear but demonstrates some mild interstitial changes likely of a chronic nature. No focal confluent infiltrate is seen no bony abnormality is noted.  IMPRESSION: No active cardiopulmonary disease.   Electronically Signed   By: Alcide CleverMark  Lukens M.D.   On: 07/19/2014 09:35     EKG Interpretation None      MDM  Normal chest x-ray. The patient is satting at 98% on RA in no respiratory distress. And is consistent with chronic asthma with exacerbation. Treat with Z-Pak for bronchitis. Prednisone taper and albuterol inhaler is prescribed. He received Solu-Medrol 125mg  and neb treatment in the ER with relief of wheezing. Final diagnoses:  Wheezing  Asthmatic bronchitis, mild intermittent, with acute exacerbation        Luvenia Reddenmma Weavil V, PA-C 07/19/14 [id number]  Minna AntisKevin Paduchowski, MD 07/19/14 [phone]

## 2015-03-31 ENCOUNTER — Emergency Department
Admission: EM | Admit: 2015-03-31 | Discharge: 2015-03-31 | Disposition: A | Discharge status: Home / Self Care | Payer: Self-pay | Attending: Emergency Medicine | Admitting: Emergency Medicine

## 2015-03-31 ENCOUNTER — Emergency Department: Payer: Self-pay

## 2015-03-31 ENCOUNTER — Encounter: Payer: Self-pay | Admitting: Emergency Medicine

## 2015-03-31 DIAGNOSIS — J069 Acute upper respiratory infection, unspecified: Secondary | ICD-10-CM | POA: Insufficient documentation

## 2015-03-31 DIAGNOSIS — Z7952 Long term (current) use of systemic steroids: Secondary | ICD-10-CM | POA: Insufficient documentation

## 2015-03-31 DIAGNOSIS — J45909 Unspecified asthma, uncomplicated: Secondary | ICD-10-CM | POA: Insufficient documentation

## 2015-03-31 LAB — RAPID INFLUENZA A&B ANTIGENS (ARMC ONLY)
INFLUENZA A (ARMC): NEGATIVE
INFLUENZA B (ARMC): NEGATIVE

## 2015-03-31 IMAGING — CR DG CHEST 2V
1 series · 2 of 2 positions shown · non-contrast
Comparison: [DATE] and [DATE].

CLINICAL DATA: Cough, congestion, shortness breath and fever today.
History of asthma.

EXAM:
CHEST  2 VIEW

[Series 1: dg chest 2 view · 0.14mm/px · 2 of 2 slices shown]
[im 1/2]
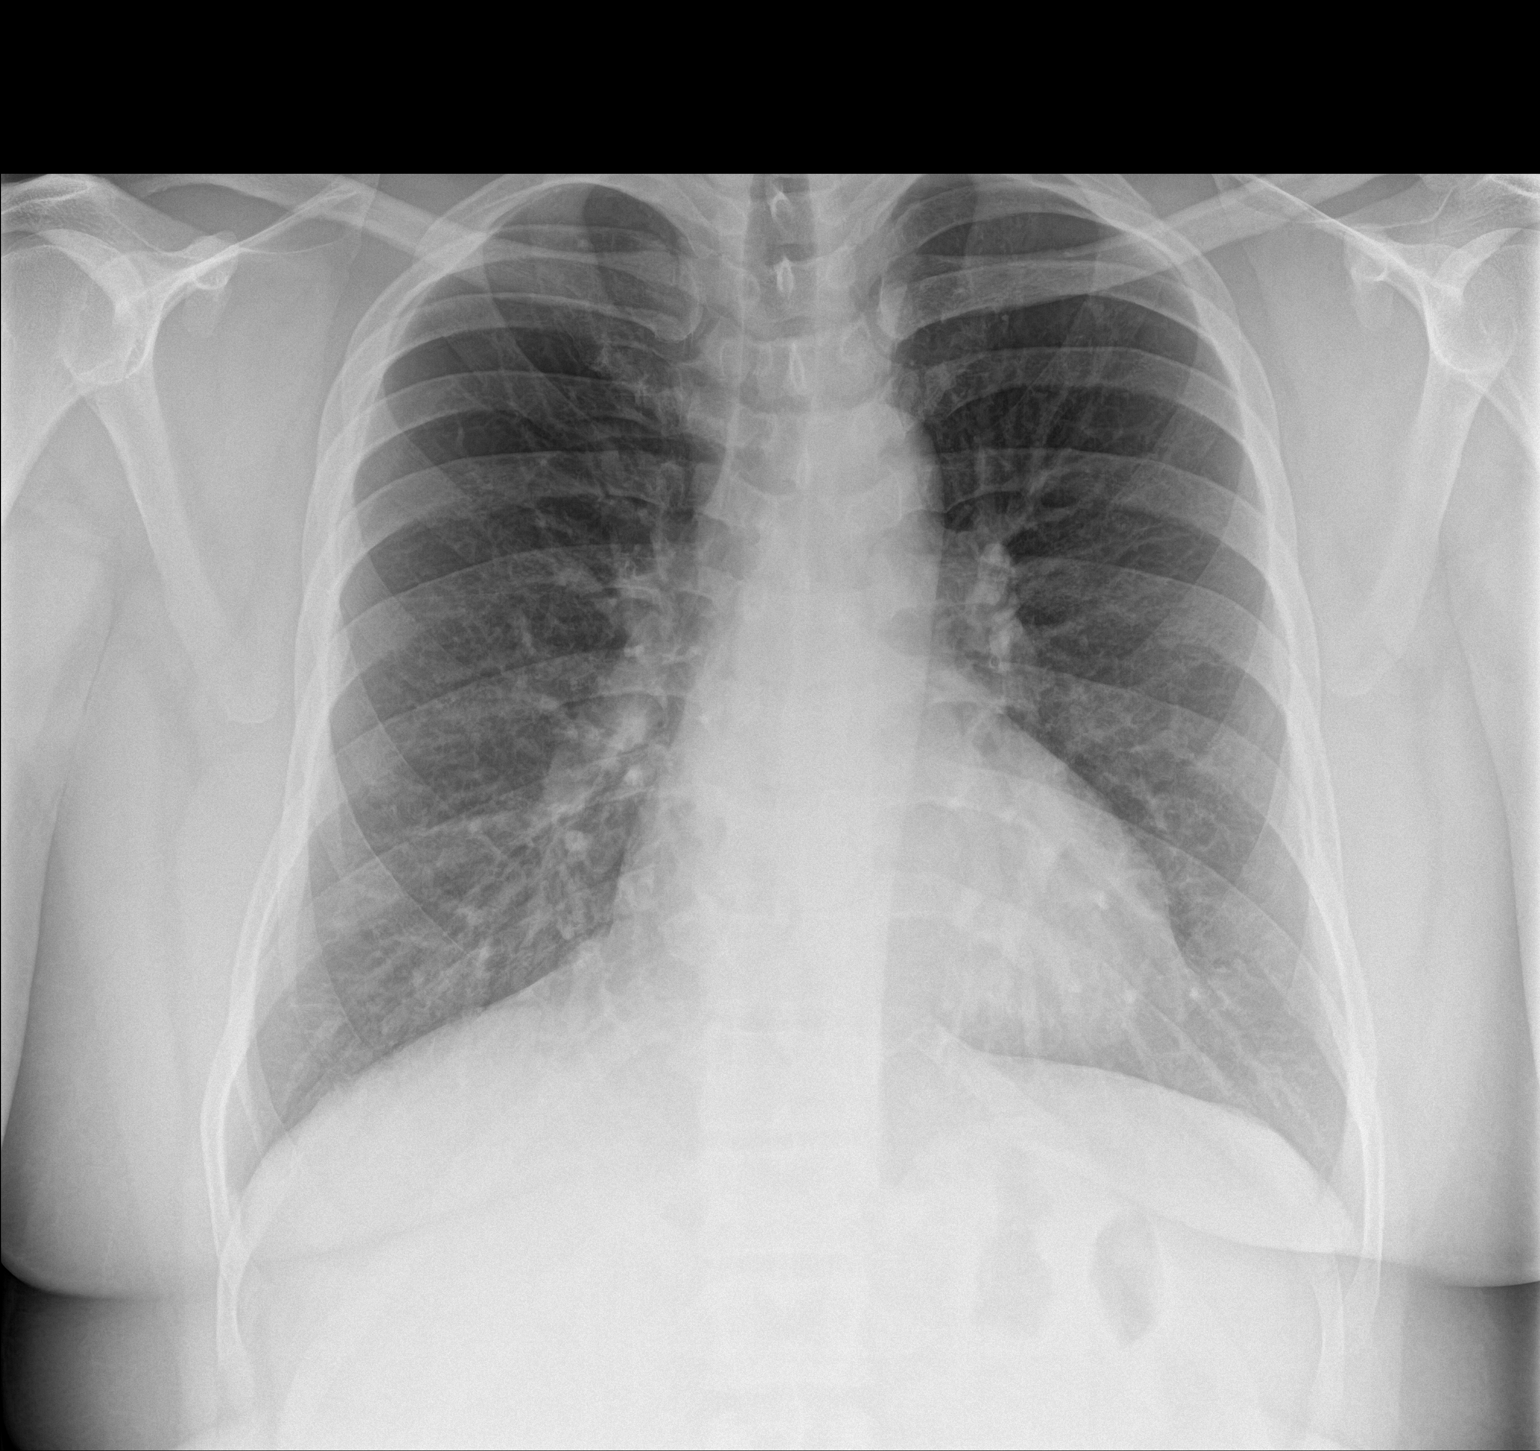
[im 2/2]
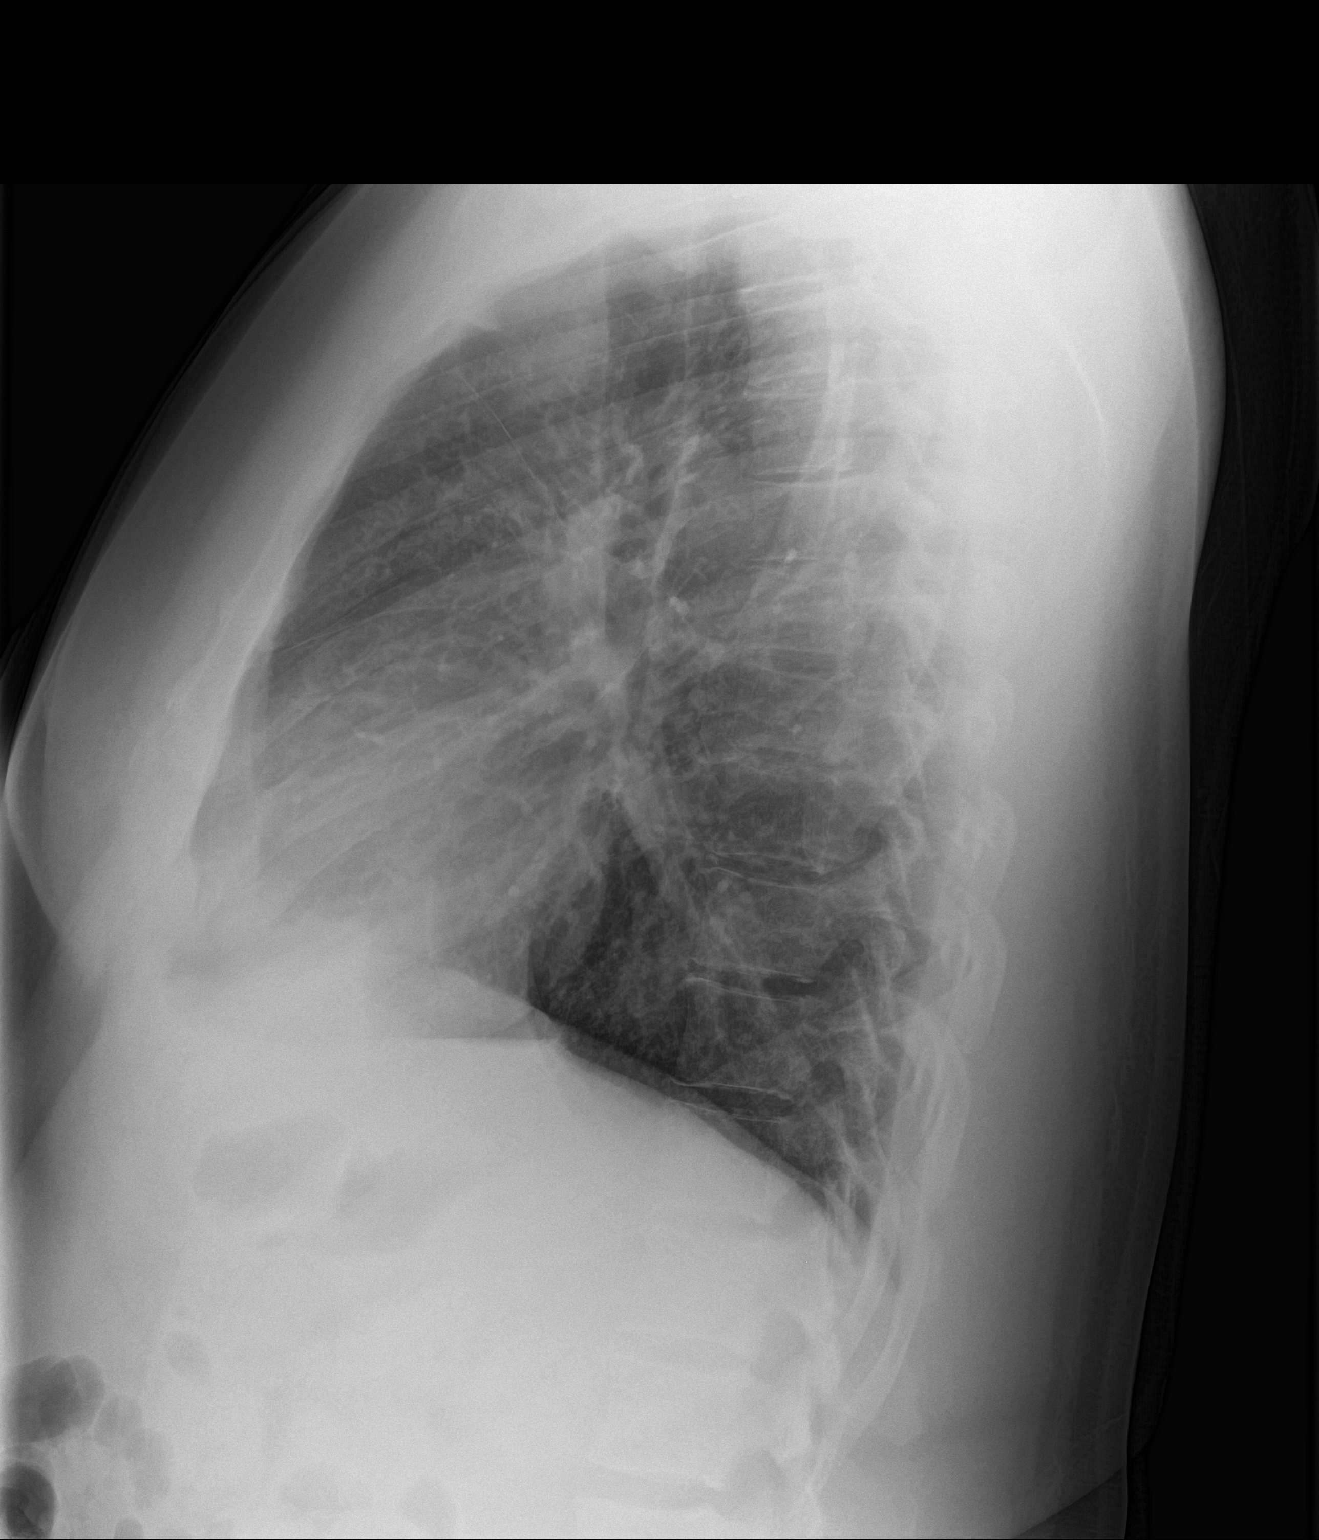

[2 of 2 positions shown; findings below may reference images not displayed]

FINDINGS: The heart size and mediastinal contours are stable. There is chronic
central airway thickening without hyperinflation, airspace disease,
edema or pleural effusion. The bones appear unchanged.
IMPRESSION: Chronic central airway thickening attributed to reactive airways
disease or bronchitis. No evidence of pneumonia.

## 2015-03-31 MED ORDER — AZITHROMYCIN 250 MG PO TABS
500.0000 mg | ORAL_TABLET | Freq: Once | ORAL | Status: AC
  Administered 2015-03-31: 500 mg via ORAL
  Filled 2015-03-31: qty 2

## 2015-03-31 MED ORDER — ALBUTEROL SULFATE (2.5 MG/3ML) 0.083% IN NEBU: 2.5000 mg | INHALATION_SOLUTION | Freq: Four times a day (QID) | RESPIRATORY_TRACT | Status: DC | PRN

## 2015-03-31 MED ORDER — IPRATROPIUM-ALBUTEROL 0.5-2.5 (3) MG/3ML IN SOLN
3.0000 mL | Freq: Once | RESPIRATORY_TRACT | Status: AC
  Administered 2015-03-31: 3 mL via RESPIRATORY_TRACT
  Filled 2015-03-31: qty 3

## 2015-03-31 MED ORDER — ALBUTEROL SULFATE HFA 108 (90 BASE) MCG/ACT IN AERS: 2.0000 | INHALATION_SPRAY | Freq: Four times a day (QID) | RESPIRATORY_TRACT | Status: DC | PRN

## 2015-03-31 MED ORDER — AZITHROMYCIN 250 MG PO TABS: ORAL_TABLET | ORAL | Status: AC

## 2015-03-31 MED ORDER — BENZONATATE 100 MG PO CAPS: 100.0000 mg | ORAL_CAPSULE | Freq: Four times a day (QID) | ORAL | Status: DC | PRN

## 2015-03-31 MED ORDER — ACETAMINOPHEN 325 MG PO TABS
650.0000 mg | ORAL_TABLET | Freq: Once | ORAL | Status: AC
  Administered 2015-03-31: 650 mg via ORAL
  Filled 2015-03-31: qty 2

## 2015-03-31 NOTE — Discharge Instructions (Signed)
Upper Respiratory Infection, Adult Most upper respiratory infections (URIs) are a viral infection of the air passages leading to the lungs. A URI affects the nose, throat, and upper air passages. The most common type of URI is nasopharyngitis and is typically referred to as "the common cold." URIs run their course and usually go away on their own. Most of the time, a URI does not require medical attention, but sometimes a bacterial infection in the upper airways can follow a viral infection. This is called a secondary infection. Sinus and middle ear infections are common types of secondary upper respiratory infections. Bacterial pneumonia can also complicate a URI. A URI can worsen asthma and chronic obstructive pulmonary disease (COPD). Sometimes, these complications can require emergency medical care and may be life threatening.  CAUSES Almost all URIs are caused by viruses. A virus is a type of germ and can spread from one person to another.  RISKS FACTORS You may be at risk for a URI if:   You smoke.   You have chronic heart or lung disease.  You have a weakened defense (immune) system.   You are very young or very old.   You have nasal allergies or asthma.  You work in crowded or poorly ventilated areas.  You work in health care facilities or schools. SIGNS AND SYMPTOMS  Symptoms typically develop 2-3 days after you come in contact with a cold virus. Most viral URIs last 7-10 days. However, viral URIs from the influenza virus (flu virus) can last 14-18 days and are typically more severe. Symptoms may include:   Runny or stuffy (congested) nose.   Sneezing.   Cough.   Sore throat.   Headache.   Fatigue.   Fever.   Loss of appetite.   Pain in your forehead, behind your eyes, and over your cheekbones (sinus pain).  Muscle aches.  DIAGNOSIS  Your health care provider may diagnose a URI by:  Physical exam.  Tests to check that your symptoms are not due to  another condition such as:  Strep throat.  Sinusitis.  Pneumonia.  Asthma. TREATMENT  A URI goes away on its own with time. It cannot be cured with medicines, but medicines may be prescribed or recommended to relieve symptoms. Medicines may help:  Reduce your fever.  Reduce your cough.  Relieve nasal congestion. HOME CARE INSTRUCTIONS   Take medicines only as directed by your health care provider.   Gargle warm saltwater or take cough drops to comfort your throat as directed by your health care provider.  Use a warm mist humidifier or inhale steam from a shower to increase air moisture. This may make it easier to breathe.  Drink enough fluid to keep your urine clear or pale yellow.   Eat soups and other clear broths and maintain good nutrition.   Rest as needed.   Return to work when your temperature has returned to normal or as your health care provider advises. You may need to stay home longer to avoid infecting others. You can also use a face mask and careful hand washing to prevent spread of the virus.  Increase the usage of your inhaler if you have asthma.   Do not use any tobacco products, including cigarettes, chewing tobacco, or electronic cigarettes. If you need help quitting, ask your health care provider. PREVENTION  The best way to protect yourself from getting a cold is to practice good hygiene.   Avoid oral or hand contact with people with cold   symptoms.   Wash your hands often if contact occurs.  There is no clear evidence that vitamin C, vitamin E, echinacea, or exercise reduces the chance of developing a cold. However, it is always recommended to get plenty of rest, exercise, and practice good nutrition.  SEEK MEDICAL CARE IF:   You are getting worse rather than better.   Your symptoms are not controlled by medicine.   You have chills.  You have worsening shortness of breath.  You have brown or red mucus.  You have yellow or brown nasal  discharge.  You have pain in your face, especially when you bend forward.  You have a fever.  You have swollen neck glands.  You have pain while swallowing.  You have white areas in the back of your throat. SEEK IMMEDIATE MEDICAL CARE IF:   You have severe or persistent:  Headache.  Ear pain.  Sinus pain.  Chest pain.  You have chronic lung disease and any of the following:  Wheezing.  Prolonged cough.  Coughing up blood.  A change in your usual mucus.  You have a stiff neck.  You have changes in your:  Vision.  Hearing.  Thinking.  Mood. MAKE SURE YOU:   Understand these instructions.  Will watch your condition.  Will get help right away if you are not doing well or get worse.   This information is not intended to replace advice given to you by your health care provider. Make sure you discuss any questions you have with your health care provider.   Document Released: 07/29/2000 Document Revised: 06/19/2014 Document Reviewed: 05/10/2013 Elsevier Interactive Patient Education 2016 [employer] Inc.  

## 2015-03-31 NOTE — ED Notes (Signed)
MD at bedside. 

## 2015-03-31 NOTE — ED Provider Notes (Signed)
[facility] Emergency Department Provider Note  Time seen: 6:09 PM  I have reviewed the triage vital signs and the nursing notes.   HISTORY  Chief Complaint Cough    HPI Mark [name] is a 40 y.o. male with a past medical history of asthma presents the emergency department with 3 days of cough, congestion, fever. According to the patient for the past 3 days he has been having a cough some shortness of breath, cough productive of a white/yellow sputum at times. Patient is also had fever at home. States some mild generalized weakness and body aches. Describes the cough as moderate. Denies chest pain. Shortness of breath is mild per patient. Patient has a baseline history of asthma for which he takes nebulizer treatments at home during asthma flares.     Past Medical History  Diagnosis Date  . Asthma     There are no active problems to display for this patient.   Past Surgical History  Procedure Laterality Date  . Hernia repair      Current Outpatient Rx  Name  Route  Sig  Dispense  Refill  . albuterol (PROVENTIL HFA;VENTOLIN HFA) 108 (90 BASE) MCG/ACT inhaler   Inhalation   Inhale 2 puffs into the lungs every 6 (six) hours as needed for wheezing or shortness of breath.   1 Inhaler   1   . predniSONE (DELTASONE) 10 MG tablet      Taper: 6, 5, 4, 3, 2, 1   21 tablet   0     Allergies Review of patient's allergies indicates no known allergies.  History reviewed. No pertinent family history.  Social History Social History  Substance Use Topics  . Smoking status: Never Smoker   . Smokeless tobacco: None  . Alcohol Use: No    Review of Systems Constitutional: As it for fever. Cardiovascular: Negative for chest pain. Respiratory: Negative for shortness of breath. Positive for cough and congestion. Gastrointestinal: Negative for abdominal pain Skin: Negative for rash. Neurological: Negative for headache 10-point ROS otherwise  negative.  ____________________________________________   PHYSICAL EXAM:  VITAL SIGNS: ED Triage Vitals  Enc Vitals Group     BP 03/31/15 1619 167/80 mmHg     Pulse Rate 03/31/15 1619 115     Resp 03/31/15 1619 18     Temp 03/31/15 1619 102.9 F (39.4 C)     Temp Source 03/31/15 1619 Oral     SpO2 03/31/15 1619 94 %     Weight 03/31/15 1619 230 lb (104.327 kg)     Height 03/31/15 1619  (1.753 m)     Head Cir --      Peak Flow --      Pain Score 03/31/15 1620 9     Pain Loc --      Pain Edu? --      Excl. in GC? --     Constitutional: Alert and oriented. Well appearing and in no distress. Eyes: Normal exam ENT   Head: Normocephalic and atraumatic.   Mouth/Throat: Mucous membranes are moist. No pharyngeal erythema or exudate. Normal tympanic membranes. Cardiovascular: Normal rate, regular rhythm. No murmur Respiratory: Normal respiratory effort without tachypnea nor retractions. Breath sounds are clear. Occasional cough during examination. Gastrointestinal: Soft and nontender. No distention.  Musculoskeletal: Nontender with normal range of motion in all extremities. Neurologic:  Normal speech and language. No gross focal neurologic deficits Skin:  Skin is warm, dry and intact.  Psychiatric: Mood and affect are  normal. Speech and behavior are normal  ____________________________________________   RADIOLOGY  Chest x-ray shows chronic asthma or bronchitis, no pneumonia.  INITIAL IMPRESSION / ASSESSMENT AND PLAN / ED COURSE  Pertinent labs & imaging results that were available during my care of the patient were reviewed by me and considered in my medical decision making (see chart for details).  Patient presents with 3 days of fever, cough and congestion. X-ray suggestive of bronchitis versus reactive airway disease. Patient has clear lung sounds currently. We will cover for antibiotics for bronchitis, refill the patient's nebulizer and MDI which she is out of  currently and have the patient follow-up with his primary care physician 1-2 days for recheck/reevaluation. Suspect likely viral upper respiratory infection versus acute bronchitis causing asthma exacerbation.  ____________________________________________   FINAL CLINICAL IMPRESSION(S) / ED DIAGNOSES  Upper respiratory infection   Minna Antis, MD 03/31/15 [phone]

## 2015-03-31 NOTE — ED Notes (Signed)
Cough, fever, wheezing, bodyaches

## 2015-07-05 ENCOUNTER — Emergency Department
Admission: EM | Admit: 2015-07-05 | Discharge: 2015-07-05 | Disposition: A | Discharge status: Home / Self Care | Payer: Self-pay | Attending: Emergency Medicine | Admitting: Emergency Medicine

## 2015-07-05 ENCOUNTER — Emergency Department: Payer: Self-pay

## 2015-07-05 ENCOUNTER — Encounter: Payer: Self-pay | Admitting: Emergency Medicine

## 2015-07-05 DIAGNOSIS — J4521 Mild intermittent asthma with (acute) exacerbation: Secondary | ICD-10-CM | POA: Insufficient documentation

## 2015-07-05 DIAGNOSIS — Z79899 Other long term (current) drug therapy: Secondary | ICD-10-CM | POA: Insufficient documentation

## 2015-07-05 DIAGNOSIS — E876 Hypokalemia: Secondary | ICD-10-CM | POA: Insufficient documentation

## 2015-07-05 LAB — BASIC METABOLIC PANEL
ANION GAP: 7 (ref 5–15)
BUN: 11 mg/dL (ref 6–20)
CO2: 27 mmol/L (ref 22–32)
Calcium: 8.8 mg/dL — ABNORMAL LOW (ref 8.9–10.3)
Chloride: 102 mmol/L (ref 101–111)
Creatinine, Ser: 0.97 mg/dL (ref 0.61–1.24)
GFR calc Af Amer: >60 mL/min (ref >60)
Glucose, Bld: 144 mg/dL — ABNORMAL HIGH (ref 65–99)
Potassium: 3 mmol/L — ABNORMAL LOW (ref 3.5–5.1)
Sodium: 136 mmol/L (ref 135–145)

## 2015-07-05 LAB — CBC WITH DIFFERENTIAL/PLATELET
BASOS ABS: 0.1 10*3/uL (ref 0–0.1)
Basophils Relative: 1 %
Eosinophils Absolute: 0.6 10*3/uL (ref 0–0.7)
HEMATOCRIT: 37.5 % — AB (ref 40.0–52.0)
Hemoglobin: 12.2 g/dL — ABNORMAL LOW (ref 13.0–18.0)
Lymphs Abs: 2.7 10*3/uL (ref 1.0–3.6)
MCH: 27.5 pg (ref 26.0–34.0)
MCHC: 32.4 g/dL (ref 32.0–36.0)
MCV: 84.9 fL (ref 80.0–100.0)
Monocytes Absolute: 0.9 10*3/uL (ref 0.2–1.0)
Monocytes Relative: 8 %
Neutro Abs: 6.8 10*3/uL — ABNORMAL HIGH (ref 1.4–6.5)
PLATELETS: 319 10*3/uL (ref 150–440)
RBC: 4.42 MIL/uL (ref 4.40–5.90)
RDW: 13.5 % (ref 11.5–14.5)
WBC: 11.1 10*3/uL — AB (ref 3.8–10.6)

## 2015-07-05 IMAGING — DX DG CHEST 1V PORT
1 series · 1 of 1 positions shown · non-contrast
Comparison: [DATE]

CLINICAL DATA: Asthma attack. Cold like symptoms on [REDACTED].
Wheezing and increased work of breathing.

EXAM:
PORTABLE CHEST 1 VIEW

[chest ap]
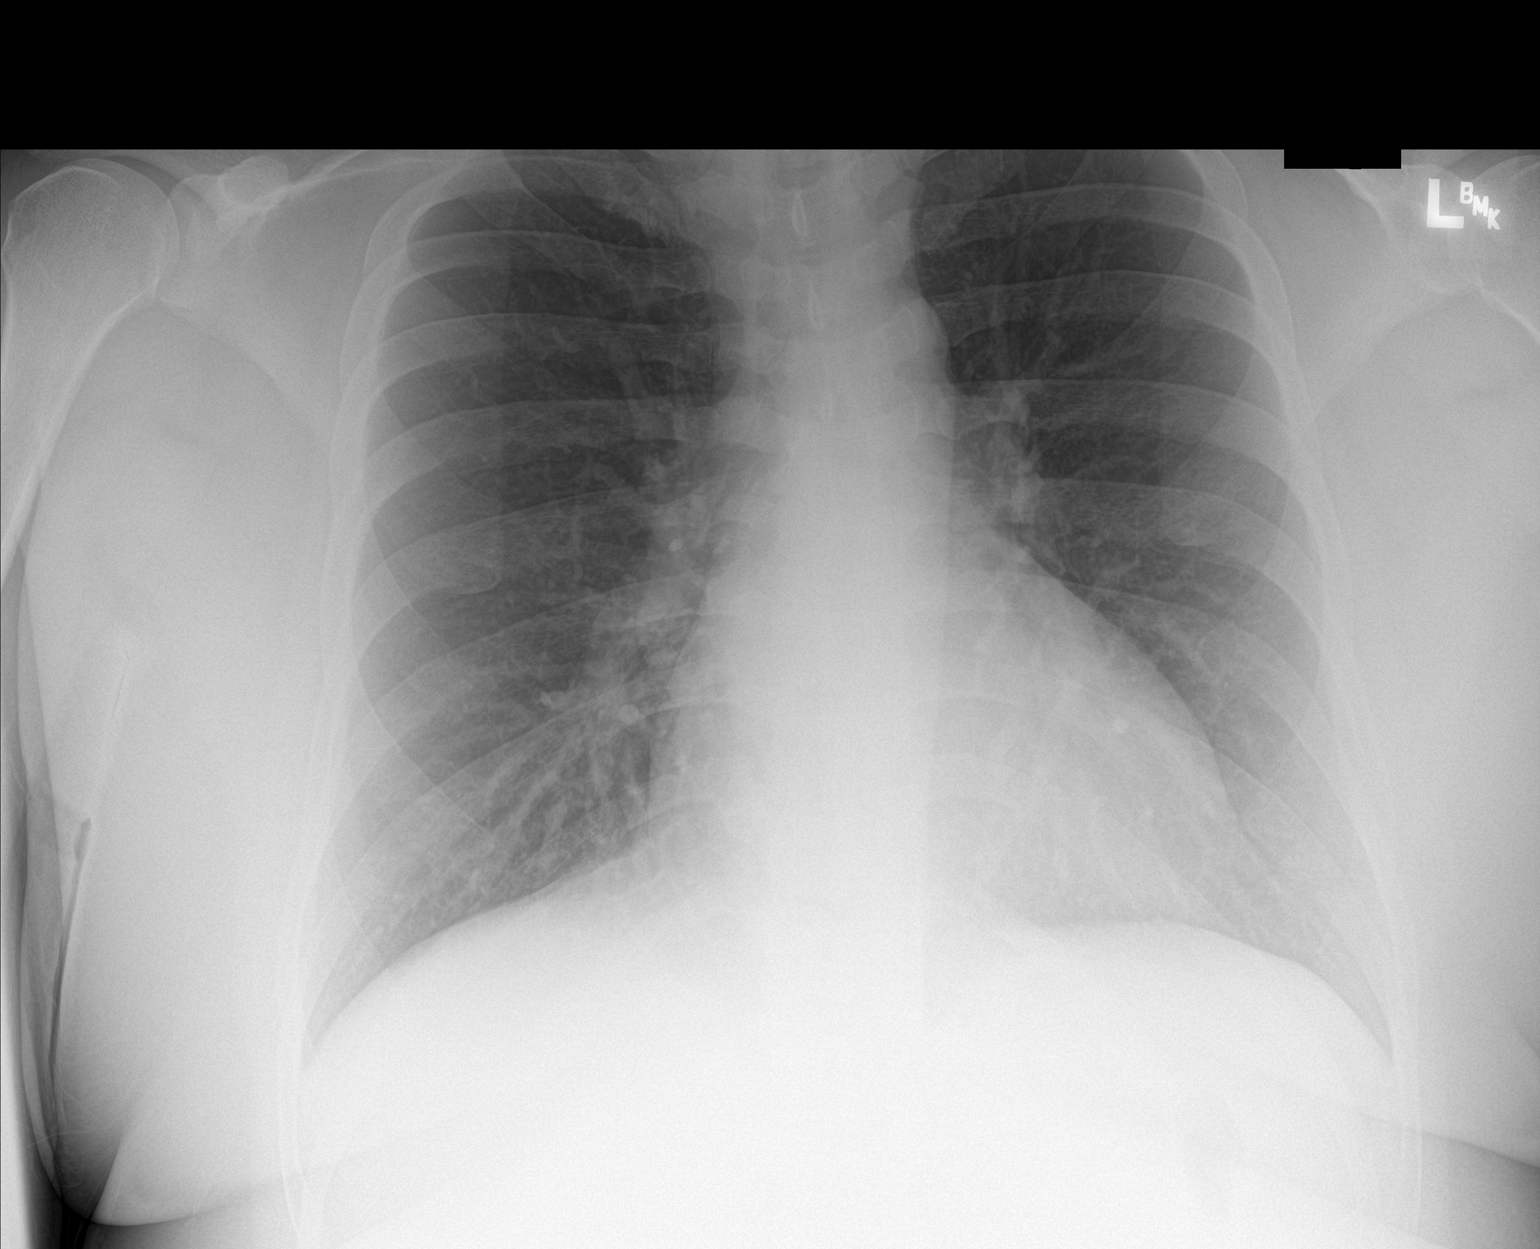

[1 of 1 positions shown; findings below may reference images not displayed]

FINDINGS: Normal heart size and pulmonary vascularity. Hyperinflation
consistent with history of asthma. Peribronchial thickening also
consistent with asthma. No focal airspace disease or consolidation.
No blunting of costophrenic angles. No pneumothorax. Mediastinal
contours appear intact.
IMPRESSION: Hyperinflation and peribronchial thickening consistent with history
of asthma. No focal consolidation.

## 2015-07-05 MED ORDER — IPRATROPIUM-ALBUTEROL 0.5-2.5 (3) MG/3ML IN SOLN
3.0000 mL | Freq: Once | RESPIRATORY_TRACT | Status: AC
  Administered 2015-07-05: 3 mL via RESPIRATORY_TRACT

## 2015-07-05 MED ORDER — PREDNISONE 20 MG PO TABS
ORAL_TABLET | ORAL | Status: DC
Start: 2015-07-05 — End: 2016-02-15

## 2015-07-05 MED ORDER — METHYLPREDNISOLONE SODIUM SUCC 125 MG IJ SOLR
INTRAMUSCULAR | Status: AC
  Administered 2015-07-05: 125 mg via INTRAVENOUS
  Filled 2015-07-05: qty 2

## 2015-07-05 MED ORDER — IPRATROPIUM-ALBUTEROL 0.5-2.5 (3) MG/3ML IN SOLN
RESPIRATORY_TRACT | Status: AC
  Administered 2015-07-05: 3 mL via RESPIRATORY_TRACT
  Filled 2015-07-05: qty 3

## 2015-07-05 MED ORDER — IPRATROPIUM-ALBUTEROL 0.5-2.5 (3) MG/3ML IN SOLN
3.0000 mL | Freq: Once | RESPIRATORY_TRACT | Status: AC
  Administered 2015-07-05: 3 mL via RESPIRATORY_TRACT
  Filled 2015-07-05: qty 3

## 2015-07-05 MED ORDER — POTASSIUM CHLORIDE CRYS ER 20 MEQ PO TBCR
40.0000 meq | EXTENDED_RELEASE_TABLET | Freq: Once | ORAL | Status: AC
  Administered 2015-07-05: 40 meq via ORAL
  Filled 2015-07-05: qty 2

## 2015-07-05 MED ORDER — METHYLPREDNISOLONE SODIUM SUCC 125 MG IJ SOLR
125.0000 mg | Freq: Once | INTRAMUSCULAR | Status: AC
  Administered 2015-07-05: 125 mg via INTRAVENOUS

## 2015-07-05 NOTE — ED Notes (Signed)
Pt presents to ED with asthma attack. Used inhaler at home. Cold like symptoms on Wednesday which aggravated his asthma. Wheezing and increased work of breathing present hard to talk in complete sentences.

## 2015-07-05 NOTE — Discharge Instructions (Signed)
1. Take steroid as prescribed (prednisone 60 mg daily 4 days). 2. Continue your albuterol inhaler 2 puffs every 4 hours as needed for wheezing. 3. Return to the ER for worsening symptoms, persistent vomiting, difficulty breathing or other concerns.  Asthma, Adult Asthma is a recurring condition in which the airways tighten and narrow. Asthma can make it difficult to breathe. It can cause coughing, wheezing, and shortness of breath. Asthma episodes, also called asthma attacks, range from minor to life-threatening. Asthma cannot be cured, but medicines and lifestyle changes can help control it. CAUSES Asthma is believed to be caused by inherited (genetic) and environmental factors, but its exact cause is unknown. Asthma may be triggered by allergens, lung infections, or irritants in the air. Asthma triggers are different for each person. Common triggers include:   Animal dander.  Dust mites.  Cockroaches.  Pollen from trees or grass.  Mold.  Smoke.  Air pollutants such as dust, household cleaners, hair sprays, aerosol sprays, paint fumes, strong chemicals, or strong odors.  Cold air, weather changes, and winds (which increase molds and pollens in the air).  Strong emotional expressions such as crying or laughing hard.  Stress.  Certain medicines (such as aspirin) or types of drugs (such as beta-blockers).  Sulfites in foods and drinks. Foods and drinks that may contain sulfites include dried fruit, potato chips, and sparkling grape juice.  Infections or inflammatory conditions such as the flu, a cold, or an inflammation of the nasal membranes (rhinitis).  Gastroesophageal reflux disease (GERD).  Exercise or strenuous activity. SYMPTOMS Symptoms may occur immediately after asthma is triggered or many hours later. Symptoms include:  Wheezing.  Excessive nighttime or early morning coughing.  Frequent or severe coughing with a common cold.  Chest tightness.  Shortness of  breath. DIAGNOSIS  The diagnosis of asthma is made by a review of your medical history and a physical exam. Tests may also be performed. These may include:  Lung function studies. These tests show how much air you breathe in and out.  Allergy tests.  Imaging tests such as X-rays. TREATMENT  Asthma cannot be cured, but it can usually be controlled. Treatment involves identifying and avoiding your asthma triggers. It also involves medicines. There are 2 classes of medicine used for asthma treatment:   Controller medicines. These prevent asthma symptoms from occurring. They are usually taken every day.  Reliever or rescue medicines. These quickly relieve asthma symptoms. They are used as needed and provide short-term relief. Your health care provider will help you create an asthma action plan. An asthma action plan is a written plan for managing and treating your asthma attacks. It includes a list of your asthma triggers and how they may be avoided. It also includes information on when medicines should be taken and when their dosage should be changed. An action plan may also involve the use of a device called a peak flow meter. A peak flow meter measures how well the lungs are working. It helps you monitor your condition. HOME CARE INSTRUCTIONS   Take medicines only as directed by your health care provider. Speak with your health care provider if you have questions about how or when to take the medicines.  Use a peak flow meter as directed by your health care provider. Record and keep track of readings.  Understand and use the action plan to help minimize or stop an asthma attack without needing to seek medical care.  Control your home environment in the following ways  to help prevent asthma attacks:  Do not smoke. Avoid being exposed to secondhand smoke.  Change your heating and air conditioning filter regularly.  Limit your use of fireplaces and wood stoves.  Get rid of pests (such as  roaches and mice) and their droppings.  Throw away plants if you see mold on them.  Clean your floors and dust regularly. Use unscented cleaning products.  Try to have someone else vacuum for you regularly. Stay out of rooms while they are being vacuumed and for a short while afterward. If you vacuum, use a dust mask from a hardware store, a double-layered or microfilter vacuum cleaner bag, or a vacuum cleaner with a HEPA filter.  Replace carpet with wood, tile, or vinyl flooring. Carpet can trap dander and dust.  Use allergy-proof pillows, mattress covers, and box spring covers.  Wash bed sheets and blankets every week in hot water and dry them in a dryer.  Use blankets that are made of polyester or cotton.  Clean bathrooms and kitchens with bleach. If possible, have someone repaint the walls in these rooms with mold-resistant paint. Keep out of the rooms that are being cleaned and painted.  Wash hands frequently. SEEK MEDICAL CARE IF:   You have wheezing, shortness of breath, or a cough even if taking medicine to prevent attacks.  The colored mucus you cough up (sputum) is thicker than usual.  Your sputum changes from clear or white to yellow, green, gray, or bloody.  You have any problems that may be related to the medicines you are taking (such as a rash, itching, swelling, or trouble breathing).  You are using a reliever medicine more than 2-3 times per week.  Your peak flow is still at 50-79% of your personal best after following your action plan for 1 hour.  You have a fever. SEEK IMMEDIATE MEDICAL CARE IF:   You seem to be getting worse and are unresponsive to treatment during an asthma attack.  You are short of breath even at rest.  You get short of breath when doing very little physical activity.  You have difficulty eating, drinking, or talking due to asthma symptoms.  You develop chest pain.  You develop a fast heartbeat.  You have a bluish color to your  lips or fingernails.  You are light-headed, dizzy, or faint.  Your peak flow is less than 50% of your personal best.   This information is not intended to replace advice given to you by your health care provider. Make sure you discuss any questions you have with your health care provider.   Document Released: 02/02/2005 Document Revised: 10/24/2014 Document Reviewed: 09/01/2012 Elsevier Interactive Patient Education 2016 [employer].  Hypokalemia Hypokalemia means that the amount of potassium in the blood is lower than normal.Potassium is a chemical, called an electrolyte, that helps regulate the amount of fluid in the body. It also stimulates muscle contraction and helps nerves function properly.Most of the body's potassium is inside of cells, and only a very small amount is in the blood. Because the amount in the blood is so small, minor changes can be life-threatening. CAUSES  Antibiotics.  Diarrhea or vomiting.  Using laxatives too much, which can cause diarrhea.  Chronic kidney disease.  Water pills (diuretics).  Eating disorders (bulimia).  Low magnesium level.  Sweating a lot. SIGNS AND SYMPTOMS  Weakness.  Constipation.  Fatigue.  Muscle cramps.  Mental confusion.  Skipped heartbeats or irregular heartbeat (palpitations).  Tingling or numbness. DIAGNOSIS  Your health care provider can diagnose hypokalemia with blood tests. In addition to checking your potassium level, your health care provider may also check other lab tests. TREATMENT Hypokalemia can be treated with potassium supplements taken by mouth or adjustments in your current medicines. If your potassium level is very low, you may need to get potassium through a vein (IV) and be monitored in the hospital. A diet high in potassium is also helpful. Foods high in potassium are:  Nuts, such as peanuts and pistachios.  Seeds, such as sunflower seeds and pumpkin seeds.  Peas, lentils, and lima  beans.  Whole grain and bran cereals and breads.  Fresh fruit and vegetables, such as apricots, avocado, bananas, cantaloupe, kiwi, oranges, tomatoes, asparagus, and potatoes.  Orange and tomato juices.  Red meats.  Fruit yogurt. HOME CARE INSTRUCTIONS  Take all medicines as prescribed by your health care provider.  Maintain a healthy diet by including nutritious food, such as fruits, vegetables, nuts, whole grains, and lean meats.  If you are taking a laxative, be sure to follow the directions on the label. SEEK MEDICAL CARE IF:  Your weakness gets worse.  You feel your heart pounding or racing.  You are vomiting or having diarrhea.  You are diabetic and having trouble keeping your blood glucose in the normal range. SEEK IMMEDIATE MEDICAL CARE IF:  You have chest pain, shortness of breath, or dizziness.  You are vomiting or having diarrhea for more than 2 days.  You faint. MAKE SURE YOU:   Understand these instructions.  Will watch your condition.  Will get help right away if you are not doing well or get worse.   This information is not intended to replace advice given to you by your health care provider. Make sure you discuss any questions you have with your health care provider.   Document Released: 02/02/2005 Document Revised: 02/23/2014 Document Reviewed: 08/05/2012 Elsevier Interactive Patient Education [employer]2016 [employer] Inc.

## 2015-07-05 NOTE — ED Notes (Addendum)
Pt presents to ED with asthma attack. Used inhaler at home. Developed cold like symptoms on Wednesday which aggravated his asthma. Wheezing and increased work of breathing present.

## 2015-07-05 NOTE — ED Provider Notes (Signed)
[facility] Emergency Department Provider Note   ____________________________________________  Time seen: Approximately 3:05 AM  I have reviewed the triage vital signs and the nursing notes.   HISTORY  Chief Complaint Asthma    HPI Mark ForemanHasani D [name] is a 40 y.o. male who presents to the ED from home with a chief complaint asthma attack. Patient has a history of asthma, requiring intubation approximately 20 years ago. Last hospitalization was also approximately 20 years ago. Patient has had URI symptoms for the last several days, felt like he was improving but this evening had increased shortness of breath, nonproductive cough and wheezing. Denies associated fever, chills, chest pain, abdominal pain, nausea, vomiting, diarrhea. Denies recent travel or trauma. Using inhaler without relief of symptoms.   Past Medical History  Diagnosis Date  . Asthma     There are no active problems to display for this patient.   Past Surgical History  Procedure Laterality Date  . Hernia repair      Current Outpatient Rx  Name  Route  Sig  Dispense  Refill  . albuterol (PROVENTIL) (2.5 MG/3ML) 0.083% nebulizer solution   Nebulization   Take 3 mLs (2.5 mg total) by nebulization every 6 (six) hours as needed for wheezing or shortness of breath.   75 mL   0   . albuterol (PROVENTIL HFA;VENTOLIN HFA) 108 (90 Base) MCG/ACT inhaler   Inhalation   Inhale 2 puffs into the lungs every 6 (six) hours as needed for wheezing or shortness of breath.   1 Inhaler   0   . predniSONE (DELTASONE) 20 MG tablet      3 tablets daily 4 days   12 tablet   0     Allergies Review of patient's allergies indicates no known allergies.  No family history on file.  Social History Social History  Substance Use Topics  . Smoking status: Never Smoker   . Smokeless tobacco: None  . Alcohol Use: No    Review of Systems  Constitutional: No fever/chills. Eyes: No visual  changes. ENT: No sore throat. Cardiovascular: Denies chest pain. Respiratory: Positive for cough, wheezing and shortness of breath. Gastrointestinal: No abdominal pain.  No nausea, no vomiting.  No diarrhea.  No constipation. Genitourinary: Negative for dysuria. Musculoskeletal: Negative for back pain. Skin: Negative for rash. Neurological: Negative for headaches, focal weakness or numbness.  10-point ROS otherwise negative.  ____________________________________________   PHYSICAL EXAM:  VITAL SIGNS: ED Triage Vitals  Enc Vitals Group     BP 07/05/15 0259 123/65 mmHg     Pulse Rate 07/05/15 0259 102     Resp 07/05/15 0259 20     Temp 07/05/15 0259 98 F (36.7 C)     Temp Source 07/05/15 0259 Oral     SpO2 07/05/15 0259 98 %     Weight 07/05/15 0259 200 lb (90.719 kg)     Height 07/05/15 0259 5\' 9"  (1.753 m)     Head Cir --      Peak Flow --      Pain Score --      Pain Loc --      Pain Edu? --      Excl. in GC? --     Constitutional: Alert and oriented. Well appearing and in moderate acute distress. Eyes: Conjunctivae are normal. PERRL. EOMI. Head: Atraumatic. Nose: No congestion/rhinnorhea. Mouth/Throat: Mucous membranes are moist.  Oropharynx non-erythematous. Neck: No stridor.   Cardiovascular: Normal rate, regular rhythm. Grossly normal  heart sounds.  Good peripheral circulation. Respiratory: Increased respiratory effort.  No retractions. Lungs with audible diffuse wheezing. Gastrointestinal: Soft and nontender. No distention. No abdominal bruits. No CVA tenderness. Musculoskeletal: No lower extremity tenderness nor edema.  No joint effusions. Neurologic:  Normal speech and language. No gross focal neurologic deficits are appreciated. No gait instability. Skin:  Skin is warm, dry and intact. No rash noted. Psychiatric: Mood and affect are normal. Speech and behavior are normal.  ____________________________________________   LABS (all labs ordered are listed,  but only abnormal results are displayed)  Labs Reviewed  CBC WITH DIFFERENTIAL/PLATELET - Abnormal; Notable for the following:    WBC 11.1 (*)    Hemoglobin 12.2 (*)    HCT 37.5 (*)    Neutro Abs 6.8 (*)    All other components within normal limits  BASIC METABOLIC PANEL - Abnormal; Notable for the following:    Potassium 3.0 (*)    Glucose, Bld 144 (*)    Calcium 8.8 (*)    All other components within normal limits   ____________________________________________  EKG  ED ECG REPORT I, Shanon Seawright J, the attending physician, personally viewed and interpreted this ECG.   Date: 07/05/2015  EKG Time: 0335  Rate: 86  Rhythm: normal EKG, normal sinus rhythm  Axis: Normal  Intervals:none  ST&T Change: Nonspecific  ____________________________________________  RADIOLOGY  Portable chest x-ray (viewed by me, interpreted per Dr. Andria Meuse): Hyperinflation and peribronchial thickening consistent with history of asthma. No focal consolidation. ____________________________________________   PROCEDURES  Procedure(s) performed: None  Critical Care performed: No  ____________________________________________   INITIAL IMPRESSION / ASSESSMENT AND PLAN / ED COURSE  Pertinent labs & imaging results that were available during my care of the patient were reviewed by me and considered in my medical decision making (see chart for details).  40 year old male with a history of asthma who presents with asthma exacerbation. Will initiate DuoNeb, IV Solu-Medrol and reassess.  ----------------------------------------- 3:51 AM on 07/05/2015 -----------------------------------------  Aeration improved. Wheezing remains. Will administer second DuoNeb.  ----------------------------------------- 5:22 AM on 07/05/2015 -----------------------------------------  Wheezing much improved. Room air saturations 100%. Patient is overall feeling much better. Will continue prednisone for an additional  4 days, patient will follow-up closely with his PCP. Strict return precautions given. Patient verbalizes understanding and agrees with plan of care. ____________________________________________   FINAL CLINICAL IMPRESSION(S) / ED DIAGNOSES  Final diagnoses:  Asthma, mild intermittent, with acute exacerbation  Hypokalemia      NEW MEDICATIONS STARTED DURING THIS VISIT:  New Prescriptions   PREDNISONE (DELTASONE) 20 MG TABLET    3 tablets daily 4 days     Note:  This document was prepared using Dragon voice recognition software and may include unintentional dictation errors.    Irean Hong, MD 07/05/15 [phone]

## 2015-11-29 ENCOUNTER — Emergency Department
Admission: EM | Admit: 2015-11-29 | Discharge: 2015-11-29 | Disposition: A | Discharge status: Home / Self Care | Payer: PRIVATE HEALTH INSURANCE | Attending: Emergency Medicine | Admitting: Emergency Medicine

## 2015-11-29 ENCOUNTER — Encounter: Payer: Self-pay | Admitting: Emergency Medicine

## 2015-11-29 DIAGNOSIS — R0602 Shortness of breath: Secondary | ICD-10-CM | POA: Diagnosis present

## 2015-11-29 DIAGNOSIS — Z79899 Other long term (current) drug therapy: Secondary | ICD-10-CM | POA: Diagnosis not present

## 2015-11-29 DIAGNOSIS — J45901 Unspecified asthma with (acute) exacerbation: Secondary | ICD-10-CM | POA: Insufficient documentation

## 2015-11-29 MED ORDER — PREDNISONE 20 MG PO TABS
60.0000 mg | ORAL_TABLET | Freq: Once | ORAL | Status: AC
  Administered 2015-11-29: 60 mg via ORAL
  Filled 2015-11-29: qty 3

## 2015-11-29 MED ORDER — IPRATROPIUM-ALBUTEROL 0.5-2.5 (3) MG/3ML IN SOLN
3.0000 mL | Freq: Once | RESPIRATORY_TRACT | Status: AC
  Administered 2015-11-29: 3 mL via RESPIRATORY_TRACT
  Filled 2015-11-29: qty 3

## 2015-11-29 MED ORDER — PREDNISONE 20 MG PO TABS: 40.0000 mg | ORAL_TABLET | Freq: Every day | ORAL | 0 refills | Status: DC

## 2015-11-29 MED ORDER — ALBUTEROL SULFATE HFA 108 (90 BASE) MCG/ACT IN AERS: 2.0000 | INHALATION_SPRAY | Freq: Four times a day (QID) | RESPIRATORY_TRACT | 0 refills | Status: DC | PRN

## 2015-11-29 NOTE — ED Provider Notes (Signed)
[facility] Emergency Department Provider Note    ____________________________________________   I have reviewed the triage vital signs and the nursing notes.   HISTORY  Chief Complaint Asthma   History limited by: Not Limited   HPI Mark [name] is a 40 y.o. male with history of asthma who presents to the emergency department today because of concern for asthma exacerbation. The patient states that it started three days ago at work when he was exposed to pain fumes. He has tried taking his breathing treatments at home without any significant relief. He has not had any chest pain until this morning. No fevers. No significant cough.   Past Medical History:  Diagnosis Date  . Asthma     There are no active problems to display for this patient.   Past Surgical History:  Procedure Laterality Date  . HERNIA REPAIR      Prior to Admission medications   Medication Sig Start Date End Date Taking? Authorizing Provider  albuterol (PROVENTIL HFA;VENTOLIN HFA) 108 (90 Base) MCG/ACT inhaler Inhale 2 puffs into the lungs every 6 (six) hours as needed for wheezing or shortness of breath. 03/31/15   Minna Antis, MD  albuterol (PROVENTIL) (2.5 MG/3ML) 0.083% nebulizer solution Take 3 mLs (2.5 mg total) by nebulization every 6 (six) hours as needed for wheezing or shortness of breath. 03/31/15   Minna Antis, MD  predniSONE (DELTASONE) 20 MG tablet 3 tablets daily 4 days 07/05/15   Irean Hong, MD    Allergies Review of patient's allergies indicates no known allergies.  No family history on file.  Social History Social History  Substance Use Topics  . Smoking status: Never Smoker  . Smokeless tobacco: Not on file  . Alcohol use No    Review of Systems  Constitutional: Negative for fever. Cardiovascular: Positive for chest pain. Respiratory: Positive for shortness of breath. Gastrointestinal: Negative for abdominal pain, vomiting and  diarrhea. Musculoskeletal: Negative for back pain. Skin: Negative for rash. Neurological: Negative for headaches, focal weakness or numbness.  10-point ROS otherwise negative.  ____________________________________________   PHYSICAL EXAM:  VITAL SIGNS: ED Triage Vitals  Enc Vitals Group     BP 11/29/15 0522 (!) 150/92     Pulse Rate 11/29/15 0521 88     Resp 11/29/15 0521 18     Temp 11/29/15 0521 97.9 F (36.6 C)     Temp Source 11/29/15 0521 Oral     SpO2 11/29/15 0521 94 %     Weight 11/29/15 0522 300 lb (136.1 kg)     Height 11/29/15 0522 5\' 9"  (1.753 m)     Head Circumference --      Peak Flow --      Pain Score 11/29/15 0522 6   Constitutional: Alert and oriented. Well appearing and in no distress. Eyes: Conjunctivae are normal. Normal extraocular movements. ENT   Head: Normocephalic and atraumatic.   Nose: No congestion/rhinnorhea.   Mouth/Throat: Mucous membranes are moist.   Neck: No stridor. Hematological/Lymphatic/Immunilogical: No cervical lymphadenopathy. Cardiovascular: Normal rate, regular rhythm.  No murmurs, rubs, or gallops. Respiratory: Normal respiratory effort without tachypnea nor retractions. Diffuse expiratory wheezing. Gastrointestinal: Soft and nontender. No distention.  Genitourinary: Deferred Musculoskeletal: Normal range of motion in all extremities. No lower extremity edema. Neurologic:  Normal speech and language. No gross focal neurologic deficits are appreciated.  Skin:  Skin is warm, dry and intact. No rash noted. Psychiatric: Mood and affect are normal. Speech and behavior are normal. Patient  exhibits appropriate insight and judgment.  ____________________________________________    LABS (pertinent positives/negatives)  None  ____________________________________________   EKG  None  ____________________________________________     RADIOLOGY  None   ____________________________________________   PROCEDURES  Procedures  ____________________________________________   INITIAL IMPRESSION / ASSESSMENT AND PLAN / ED COURSE  Pertinent labs & imaging results that were available during my care of the patient were reviewed by me and considered in my medical decision making (see chart for details).  Patient with signs and symptoms consistent with asthma exacerbation. Will give steroids and breathing treatments and reassess.   Clinical Course   Patient states he feels better after breathing treatment. Will discharge home. ____________________________________________   FINAL CLINICAL IMPRESSION(S) / ED DIAGNOSES  Final diagnoses:  Exacerbation of asthma, unspecified asthma severity, unspecified whether persistent     Note: This dictation was prepared with Dragon dictation. Any transcriptional errors that result from this process are unintentional    Phineas SemenGraydon Lashun Ramseyer, MD 11/29/15 [phone]

## 2015-11-29 NOTE — Discharge Instructions (Signed)
Please seek medical attention for any high fevers, chest pain, shortness of breath, change in behavior, persistent vomiting, bloody stool or any other new or concerning symptoms.  

## 2015-11-29 NOTE — ED Notes (Signed)
Patient given graham crackers

## 2015-11-29 NOTE — ED Triage Notes (Signed)
Pt in with [facility] since Tuesday states has been exposed to paint which may have caused asthma flare up.

## 2016-01-09 ENCOUNTER — Encounter: Payer: Self-pay | Admitting: Emergency Medicine

## 2016-01-09 ENCOUNTER — Emergency Department
Admission: EM | Admit: 2016-01-09 | Discharge: 2016-01-09 | Disposition: A | Discharge status: Home / Self Care | Payer: PRIVATE HEALTH INSURANCE | Attending: Emergency Medicine | Admitting: Emergency Medicine

## 2016-01-09 DIAGNOSIS — R0602 Shortness of breath: Secondary | ICD-10-CM | POA: Diagnosis present

## 2016-01-09 DIAGNOSIS — Z79899 Other long term (current) drug therapy: Secondary | ICD-10-CM | POA: Insufficient documentation

## 2016-01-09 DIAGNOSIS — J45901 Unspecified asthma with (acute) exacerbation: Secondary | ICD-10-CM | POA: Insufficient documentation

## 2016-01-09 MED ORDER — IPRATROPIUM-ALBUTEROL 0.5-2.5 (3) MG/3ML IN SOLN
3.0000 mL | Freq: Once | RESPIRATORY_TRACT | Status: AC
  Administered 2016-01-09: 3 mL via RESPIRATORY_TRACT

## 2016-01-09 MED ORDER — PREDNISONE 20 MG PO TABS: 60.0000 mg | ORAL_TABLET | Freq: Every day | ORAL | 0 refills | Status: AC

## 2016-01-09 MED ORDER — IPRATROPIUM-ALBUTEROL 0.5-2.5 (3) MG/3ML IN SOLN
RESPIRATORY_TRACT | Status: AC
  Administered 2016-01-09: 3 mL via RESPIRATORY_TRACT
  Filled 2016-01-09: qty 9

## 2016-01-09 MED ORDER — ALBUTEROL SULFATE HFA 108 (90 BASE) MCG/ACT IN AERS: 2.0000 | INHALATION_SPRAY | Freq: Four times a day (QID) | RESPIRATORY_TRACT | 2 refills | Status: DC | PRN

## 2016-01-09 NOTE — ED Notes (Signed)
Pt. Going home by self, pt. States he will follow up with [facility].

## 2016-01-09 NOTE — ED Notes (Signed)
Pt. States having a cough for the past couple.  Pt. States hx of asthma.  Pt. States due to work he has been around people who have been smoking,  pt. Believes that has made his asthma worse.

## 2016-01-09 NOTE — ED Provider Notes (Signed)
[facility] Emergency Department Provider Note    First MD Initiated Contact with Patient 01/09/16 [phone]     (approximate)  I have reviewed the triage vital signs and the nursing notes.   HISTORY  Chief Complaint Respiratory Distress (Pt. states he was awaken to shortness of breath.)   HPI Mark [name] is a 40 y.o. male with history of asthma presents emergency Department with acute asthma exacerbation tonight. Patient states he awoke this evening with shortness of breath and wheezing. Patient denies any fever patient also admits to coughing as well for the past couple days. Patient states asthma exacerbation usually occurs with change in temperature. Patient states symptoms not improved with home nebulized treatment as well as inhaler.   Past Medical History:  Diagnosis Date  . Asthma     There are no active problems to display for this patient.   Past Surgical History:  Procedure Laterality Date  . HERNIA REPAIR      Prior to Admission medications   Medication Sig Start Date End Date Taking? Authorizing Provider  albuterol (PROVENTIL HFA;VENTOLIN HFA) 108 (90 Base) MCG/ACT inhaler Inhale 2 puffs into the lungs every 6 (six) hours as needed for wheezing or shortness of breath. 11/29/15  Yes Phineas SemenGraydon Goodman, MD  albuterol (PROVENTIL) (2.5 MG/3ML) 0.083% nebulizer solution Take 3 mLs (2.5 mg total) by nebulization every 6 (six) hours as needed for wheezing or shortness of breath. 03/31/15  Yes Minna AntisKevin Paduchowski, MD  beclomethasone (QVAR) 80 MCG/ACT inhaler Inhale 2 puffs into the lungs 2 (two) times daily.   Yes Historical Provider, MD  montelukast (SINGULAIR) 10 MG tablet Take 10 mg by mouth at bedtime.   Yes Historical Provider, MD  albuterol (PROVENTIL HFA;VENTOLIN HFA) 108 (90 Base) MCG/ACT inhaler Inhale 2 puffs into the lungs every 6 (six) hours as needed for wheezing or shortness of breath. 03/31/15   Minna AntisKevin Paduchowski, MD  predniSONE (DELTASONE) 20  MG tablet 3 tablets daily 4 days Patient not taking: Reported on 01/09/2016 07/05/15   Irean HongJade J Sung, MD  predniSONE (DELTASONE) 20 MG tablet Take 2 tablets (40 mg total) by mouth daily. Patient not taking: Reported on 01/09/2016 11/29/15   Phineas SemenGraydon Goodman, MD    Allergies Patient has no known allergies.  History reviewed. No pertinent family history.  Social History Social History  Substance Use Topics  . Smoking status: Never Smoker  . Smokeless tobacco: Never Used  . Alcohol use No    Review of Systems Constitutional: No fever/chills Eyes: No visual changes. ENT: No sore throat. Cardiovascular: Denies chest pain. Respiratory: Positive for wheezing and cough Gastrointestinal: No abdominal pain.  No nausea, no vomiting.  No diarrhea.  No constipation. Genitourinary: Negative for dysuria. Musculoskeletal: Negative for back pain. Skin: Negative for rash. Neurological: Negative for headaches, focal weakness or numbness.  10-point ROS otherwise negative.  ____________________________________________   PHYSICAL EXAM:  VITAL SIGNS: ED Triage Vitals  Enc Vitals Group     BP 01/09/16 0504 (!) 162/102     Pulse Rate 01/09/16 0504 83     Resp 01/09/16 0504 (!) 22     Temp 01/09/16 0504 97.4 F (36.3 C)     Temp Source 01/09/16 0504 Axillary     SpO2 01/09/16 0504 98 %     Weight 01/09/16 0505 230 lb (104.3 kg)     Height 01/09/16 0505 5\' 9"  (1.753 m)     Head Circumference --      Peak Flow --  Pain Score --      Pain Loc --      Pain Edu? --      Excl. in GC? --     Constitutional: Alert and oriented. Well appearing and in no acute distress. Eyes: Conjunctivae are normal. PERRL. EOMI. Head: Atraumatic. Nose: No congestion/rhinnorhea. Mouth/Throat: Mucous membranes are moist.  Oropharynx non-erythematous. Neck: No stridor.  Cardiovascular: Normal rate, regular rhythm. Good peripheral circulation. Grossly normal heart sounds. Respiratory: Tachypnea, course  x-ray wheezes diffusely Gastrointestinal: Soft and nontender. No distention.   Musculoskeletal: No lower extremity tenderness nor edema. No gross deformities of extremities. Neurologic:  Normal speech and language. No gross focal neurologic deficits are appreciated.  Skin:  Skin is warm, dry and intact. No rash noted. Psychiatric: Mood and affect are normal. Speech and behavior are normal.     Procedures     INITIAL IMPRESSION / ASSESSMENT AND PLAN / ED COURSE  Pertinent labs & imaging results that were available during my care of the patient were reviewed by me and considered in my medical decision making (see chart for details).  Patient received 3 DuoNeb's and 60 prednisone. On reexamination patient states that "he feels much better".   Clinical Course     ____________________________________________  FINAL CLINICAL IMPRESSION(S) / ED DIAGNOSES  Final diagnoses:  Moderate asthma with exacerbation, unspecified whether persistent     MEDICATIONS GIVEN DURING THIS VISIT:  Medications  ipratropium-albuterol (DUONEB) 0.5-2.5 (3) MG/3ML nebulizer solution 3 mL (3 mLs Nebulization Given 01/09/16 0502)  ipratropium-albuterol (DUONEB) 0.5-2.5 (3) MG/3ML nebulizer solution 3 mL (3 mLs Nebulization Given 01/09/16 0502)  ipratropium-albuterol (DUONEB) 0.5-2.5 (3) MG/3ML nebulizer solution 3 mL (3 mLs Nebulization Given 01/09/16 0502)     NEW OUTPATIENT MEDICATIONS STARTED DURING THIS VISIT:  New Prescriptions   No medications on file    Modified Medications   No medications on file    Discontinued Medications   No medications on file     Note:  This document was prepared using Dragon voice recognition software and may include unintentional dictation errors.    Darci Currentandolph N Djuna Frechette, MD 01/09/16 [phone]

## 2016-01-09 NOTE — ED Triage Notes (Signed)
Pt. States he woke this evening with shortness of breath.

## 2016-02-15 ENCOUNTER — Encounter: Payer: Self-pay | Admitting: Emergency Medicine

## 2016-02-15 ENCOUNTER — Emergency Department
Admission: EM | Admit: 2016-02-15 | Discharge: 2016-02-15 | Disposition: A | Discharge status: Home / Self Care | Payer: PRIVATE HEALTH INSURANCE | Attending: Emergency Medicine | Admitting: Emergency Medicine

## 2016-02-15 ENCOUNTER — Emergency Department: Payer: PRIVATE HEALTH INSURANCE

## 2016-02-15 DIAGNOSIS — J45901 Unspecified asthma with (acute) exacerbation: Secondary | ICD-10-CM | POA: Insufficient documentation

## 2016-02-15 IMAGING — CR DG CHEST 2V
2 series · 2 of 2 positions shown · non-contrast
Comparison: [DATE]

CLINICAL DATA: Cough and runny nose.

EXAM:
CHEST  2 VIEW

[chest pa]
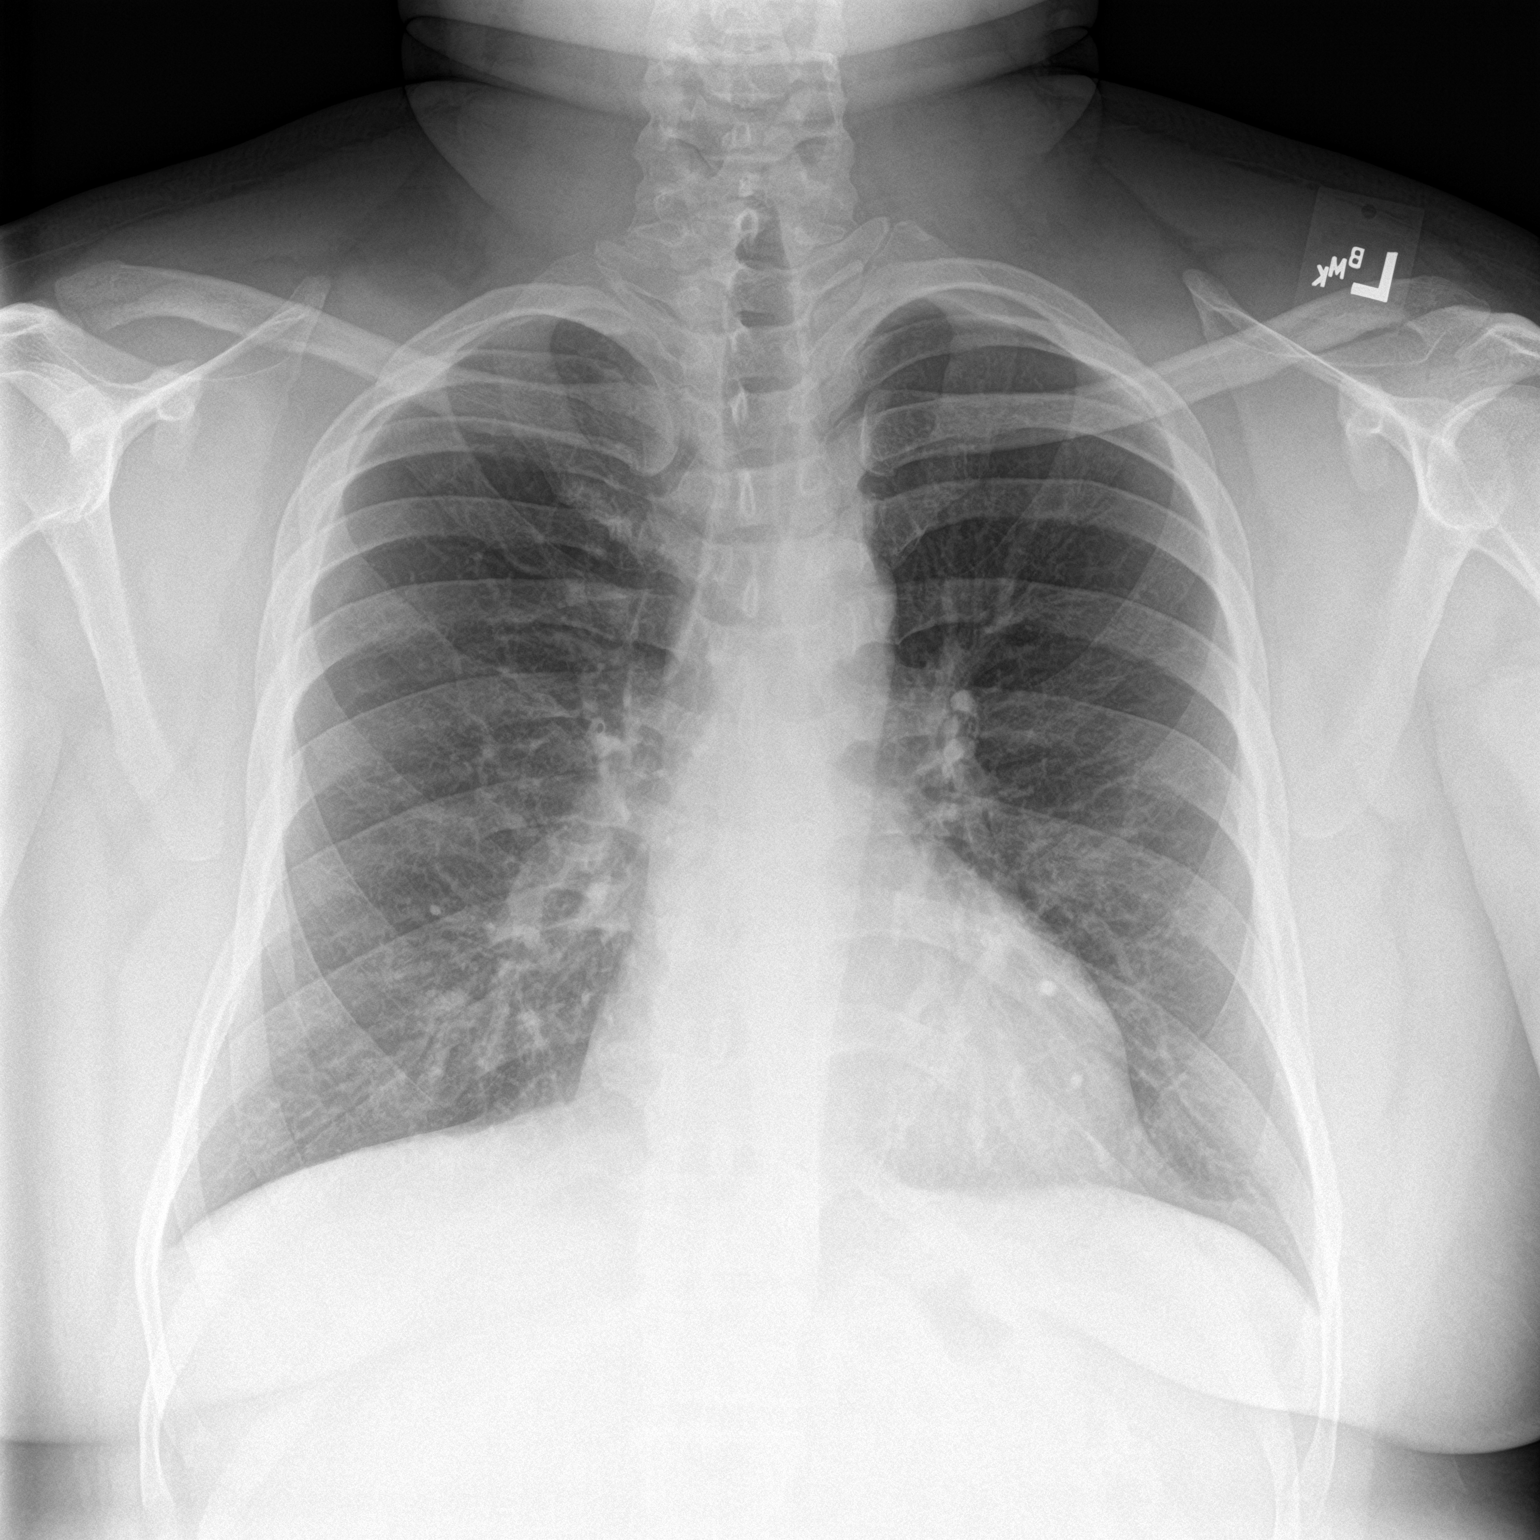

[chest lat]
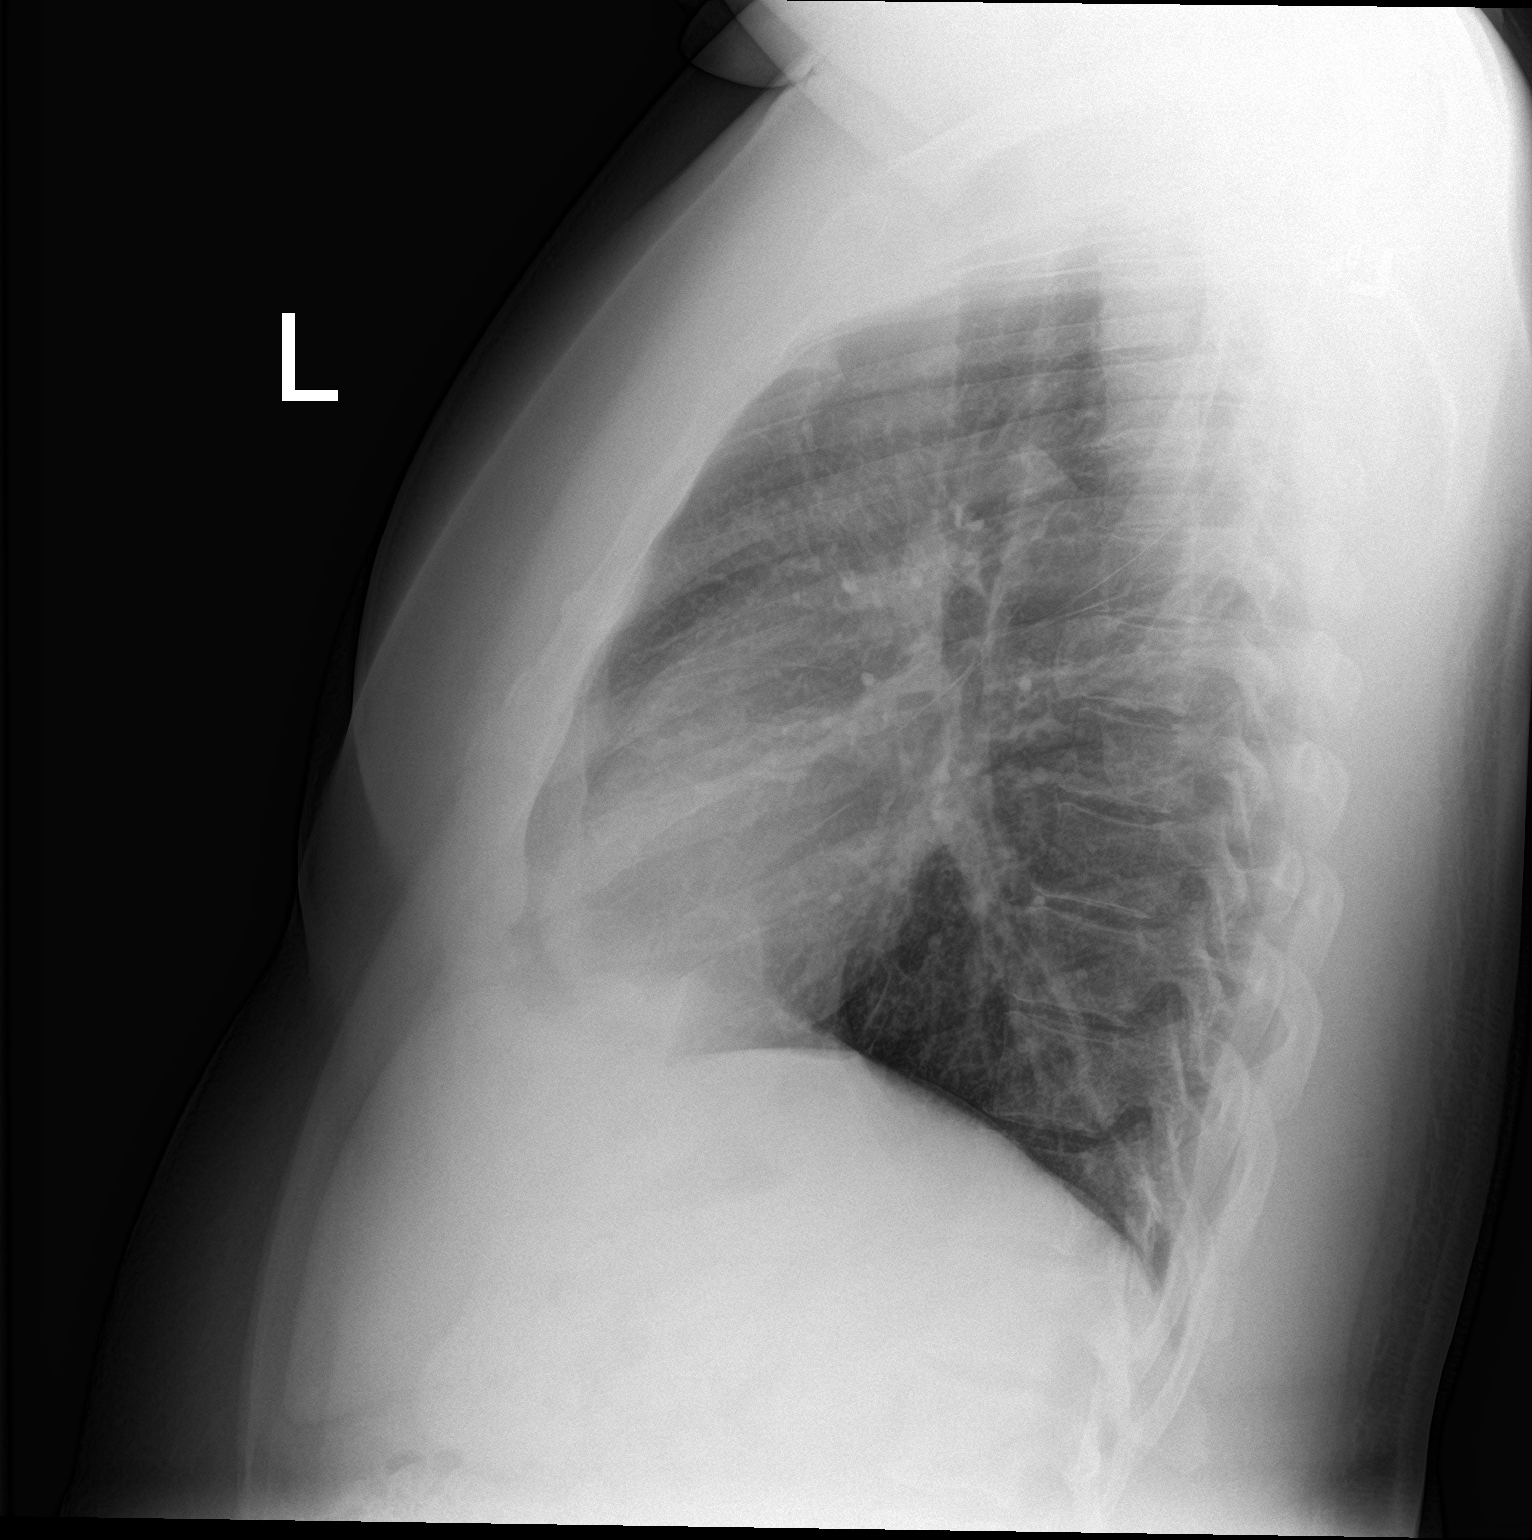

[2 of 2 positions shown; findings below may reference images not displayed]

FINDINGS: The heart size and mediastinal contours are within normal limits.
Mild hyperinflation predominantly in the upper lobes with slight
crowding of lower lobe interstitial lung markings. No pneumonic
consolidation, effusion or pneumothorax. The visualized skeletal
structures are unremarkable.
IMPRESSION: No active cardiopulmonary disease.  Mild hyperinflation of lungs.

## 2016-02-15 MED ORDER — IPRATROPIUM-ALBUTEROL 0.5-2.5 (3) MG/3ML IN SOLN
6.0000 mL | Freq: Once | RESPIRATORY_TRACT | Status: AC
  Administered 2016-02-15: 6 mL via RESPIRATORY_TRACT

## 2016-02-15 MED ORDER — PREDNISONE 10 MG PO TABS: ORAL_TABLET | ORAL | 0 refills | Status: DC

## 2016-02-15 MED ORDER — ALBUTEROL SULFATE (2.5 MG/3ML) 0.083% IN NEBU
5.0000 mg | INHALATION_SOLUTION | Freq: Once | RESPIRATORY_TRACT | Status: AC
  Administered 2016-02-15: 5 mg via RESPIRATORY_TRACT

## 2016-02-15 MED ORDER — ALBUTEROL SULFATE (2.5 MG/3ML) 0.083% IN NEBU
INHALATION_SOLUTION | RESPIRATORY_TRACT | Status: AC
  Administered 2016-02-15: 5 mg via RESPIRATORY_TRACT
  Filled 2016-02-15: qty 6

## 2016-02-15 MED ORDER — IPRATROPIUM-ALBUTEROL 0.5-2.5 (3) MG/3ML IN SOLN
RESPIRATORY_TRACT | Status: AC
  Administered 2016-02-15: 6 mL via RESPIRATORY_TRACT
  Filled 2016-02-15: qty 6

## 2016-02-15 MED ORDER — PREDNISONE 20 MG PO TABS
60.0000 mg | ORAL_TABLET | Freq: Once | ORAL | Status: AC
  Administered 2016-02-15: 60 mg via ORAL
  Filled 2016-02-15: qty 3

## 2016-02-15 MED ORDER — ALBUTEROL SULFATE HFA 108 (90 BASE) MCG/ACT IN AERS: 2.0000 | INHALATION_SPRAY | Freq: Four times a day (QID) | RESPIRATORY_TRACT | 0 refills | Status: AC | PRN

## 2016-02-15 NOTE — ED Provider Notes (Signed)
[facility] Emergency Department Provider Note ____________________________________________   I have reviewed the triage vital signs and the triage nursing note.  HISTORY  Chief Complaint Asthma and Wheezing   Historian Patient  HPI Mark [name] is a 40 y.o. 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 The change in the weather was also bothering him and then yesterday he spent time with the family that was smoking which seemed to really set off his breathing/wheezing. No chest pain. No fever. No productive cough. Yesterday he went to use his inhaler and realized that it only had 3 puffs left. He came in overnight. While he was sitting in the emergency department waiting room he realized that when his wife left yesterday she took his wallet with her, which has given him some anxiety.    Past Medical History:  Diagnosis Date  . Asthma     There are no active problems to display for this patient.   Past Surgical History:  Procedure Laterality Date  . HERNIA REPAIR      Prior to Admission medications   Medication Sig Start Date End Date Taking? Authorizing Provider  albuterol (PROVENTIL HFA;VENTOLIN HFA) 108 (90 Base) MCG/ACT inhaler Inhale 2 puffs into the lungs every 6 (six) hours as needed for wheezing or shortness of breath. 02/15/16   Governor Rooksebecca Nalani Andreen, MD  beclomethasone (QVAR) 80 MCG/ACT inhaler Inhale 2 puffs into the lungs 2 (two) times daily.    Historical Provider, MD  montelukast (SINGULAIR) 10 MG tablet Take 10 mg by mouth at bedtime.    Historical Provider, MD  predniSONE (DELTASONE) 10 MG tablet 40mg  once daily for 4 more days, start tomorrow. 02/15/16   Governor Rooksebecca Dae Highley, MD    No Known Allergies  History reviewed. No pertinent family history.  Social History Social History   Substance Use Topics  . Smoking status: Never Smoker  . Smokeless tobacco: Never Used  . Alcohol use No    Review of Systems  Constitutional: Negative for fever. Eyes: Negative for visual changes. ENT: Negative for sore throat. Cardiovascular: Negative for chest pain. Respiratory: Positive for shortness of breath. Gastrointestinal: Negative for abdominal pain, vomiting and diarrhea. Genitourinary: Negative for dysuria. Musculoskeletal: Negative for back pain. Skin: Negative for rash. Neurological: Negative for headache. 10 point Review of Systems otherwise negative ____________________________________________   PHYSICAL EXAM:  VITAL SIGNS: ED Triage Vitals  Enc Vitals Group     BP 02/15/16 0317 136/87     Pulse Rate 02/15/16 0317 94     Resp 02/15/16 0317 (!) 24     Temp 02/15/16 0317 97.6 F (36.4 C)     Temp Source 02/15/16 0317 Oral     SpO2 02/15/16 0317 97 %     Weight 02/15/16 0317 230 lb (104.3 kg)     Height 02/15/16 0317 5\' 9"  (1.753 m)     Head Circumference --      Peak Flow --      Pain Score 02/15/16 0322 0     Pain Loc --      Pain Edu? --      Excl. in GC? --      Constitutional: Alert and oriented. Well appearing and in no distress. HEENT   Head: Normocephalic and atraumatic.      Eyes: Conjunctivae are  normal. PERRL. Normal extraocular movements.      Ears:         Nose: No congestion/rhinnorhea.   Mouth/Throat: Mucous membranes are moist.   Neck: No stridor. Cardiovascular/Chest: Normal rate, regular rhythm.  No murmurs, rubs, or gallops. Respiratory: Moderate wheezing in all fields. Able to speak in full sentences. Gastrointestinal: Soft. No distention, no guarding, no rebound. Nontender.    Genitourinary/rectal:Deferred Musculoskeletal: Nontender with normal range of motion in all extremities. Neurologic:  Normal speech and language. No gross or focal neurologic deficits are appreciated. Skin:  Skin is warm, dry and intact. No  rash noted. Psychiatric: Mood and affect are normal. Speech and behavior are normal. Patient exhibits appropriate insight and judgment.   ____________________________________________  LABS (pertinent positives/negatives)  Labs Reviewed - No data to display  ____________________________________________    EKG I, Governor Rooksebecca Javaughn Opdahl, MD, the attending physician have personally viewed and interpreted all ECGs.  79 bpm. Normal sinus rhythm. Narrow QRS. Normal axis. Nonspecific T wave. ____________________________________________  RADIOLOGY All Xrays were viewed by me. Imaging interpreted by Radiologist.  Chest x-ray two-view: IMPRESSION: No active cardiopulmonary disease.  Mild hyperinflation of lungs.  __________________________________________  PROCEDURES  Procedure(s) performed: None  Critical Care performed: None  ____________________________________________   ED COURSE / ASSESSMENT AND PLAN  Pertinent labs & imaging results that were available during my care of the patient were reviewed by me and considered in my medical decision making (see chart for details).    Mr. Mark [name] has a history of asthma and presented today with wheezing for which she was overnight in triage given a first dose of albuterol which she says helped somewhat. This morning after somewhat long wait in the emergency department overnight, he was still wheezing and received protocol orders to take 2 DuoNeb treatments and by mouth prednisone prior to me seeing him.  No hypoxia. Stable vital signs without history of fevers. I reviewed his chest x-ray was negative for focal infiltrate/pneumonia. On exam he still has end expiratory wheezing but is able to speak full sentences and states that he is much improved after the DuoNeb treatment just prior to my evaluation of him.  I am going to prescribe him both prednisone burst as well as albuterol inhaler. He was somewhat concerned because he actually does not have  his wallet in his possession and his wife is out of town on a cruise at this point. However it does sound like he is fairly socially connected with his church and his work, and he is going to get with somebody to help him obtain his prescriptions today.  At this point seems like asthma exacerbation, less suspicious for alternative causes of wheezing and trouble breathing.  CONSULTATIONS:   None   Patient / Family / Caregiver informed of clinical course, medical decision-making process, and agree with plan.   I discussed return precautions, follow-up instructions, and discharge instructions with patient and/or family.   ___________________________________________   FINAL CLINICAL IMPRESSION(S) / ED DIAGNOSES   Final diagnoses:  Moderate asthma with exacerbation, unspecified whether persistent              Note: This dictation was prepared with Dragon dictation. Any transcriptional errors that result from this process are unintentional    Governor Rooksebecca Jaanvi Fizer, MD 02/15/16 [phone]

## 2016-02-15 NOTE — ED Triage Notes (Signed)
Pt reports several days of cough and runny nose. Pt also reports he was sitting with a church family member who had lost another family member and several people there were smoking which made his sx worse. States using his rescue inhaler and nebs at home with a little relief. Pt out of his rescue inhaler tonight. Audible wheezes note during triage.

## 2016-02-15 NOTE — ED Notes (Signed)
Pt here for asthma exacerbation, states inhalers ran out. Mild wheezing noted in triage.

## 2016-02-15 NOTE — Discharge Instructions (Signed)
From Dr. Shaune PollackLord:  You were evaluated for wheezing and asthma exacerbation and are started on prednisone and have been given a refill of the albuterol inhaler.  Return to the emergency department for any worsening shortness of breath, trouble breathing, dizziness or passing out, chest pain, fever, or any other symptoms concerning to you.

## 2016-04-05 ENCOUNTER — Emergency Department: Payer: PRIVATE HEALTH INSURANCE

## 2016-04-05 ENCOUNTER — Encounter: Payer: Self-pay | Admitting: Emergency Medicine

## 2016-04-05 ENCOUNTER — Inpatient Hospital Stay
Admission: EM | Admit: 2016-04-05 | Discharge: 2016-04-07 | DRG: 202 | Disposition: A | Discharge status: Home / Self Care | Payer: PRIVATE HEALTH INSURANCE | Attending: Internal Medicine | Admitting: Internal Medicine

## 2016-04-05 DIAGNOSIS — J45901 Unspecified asthma with (acute) exacerbation: Principal | ICD-10-CM | POA: Diagnosis present

## 2016-04-05 DIAGNOSIS — R7301 Impaired fasting glucose: Secondary | ICD-10-CM | POA: Diagnosis present

## 2016-04-05 DIAGNOSIS — E876 Hypokalemia: Secondary | ICD-10-CM | POA: Diagnosis present

## 2016-04-05 DIAGNOSIS — J9601 Acute respiratory failure with hypoxia: Secondary | ICD-10-CM | POA: Diagnosis present

## 2016-04-05 DIAGNOSIS — Z7952 Long term (current) use of systemic steroids: Secondary | ICD-10-CM

## 2016-04-05 DIAGNOSIS — Z79899 Other long term (current) drug therapy: Secondary | ICD-10-CM

## 2016-04-05 LAB — BASIC METABOLIC PANEL
ANION GAP: 13 (ref 5–15)
BUN: 10 mg/dL (ref 6–20)
CHLORIDE: 97 mmol/L — AB (ref 101–111)
CO2: 26 mmol/L (ref 22–32)
Calcium: 8.9 mg/dL (ref 8.9–10.3)
Creatinine, Ser: 1.08 mg/dL (ref 0.61–1.24)
GFR calc Af Amer: >60 mL/min (ref >60)
GLUCOSE: 177 mg/dL — AB (ref 65–99)
POTASSIUM: 3.2 mmol/L — AB (ref 3.5–5.1)
Sodium: 136 mmol/L (ref 135–145)

## 2016-04-05 LAB — CBC WITH DIFFERENTIAL/PLATELET
BASOS ABS: 0 10*3/uL (ref 0–0.1)
Basophils Relative: 0 %
Eosinophils Absolute: 0.1 10*3/uL (ref 0–0.7)
Eosinophils Relative: 1 %
HCT: 39 % — ABNORMAL LOW (ref 40.0–52.0)
HEMOGLOBIN: 12.3 g/dL — AB (ref 13.0–18.0)
LYMPHS ABS: 1.3 10*3/uL (ref 1.0–3.6)
Lymphocytes Relative: 9 %
MCH: 27.5 pg (ref 26.0–34.0)
MCHC: 31.5 g/dL — ABNORMAL LOW (ref 32.0–36.0)
MCV: 87.2 fL (ref 80.0–100.0)
Monocytes Absolute: 0.3 10*3/uL (ref 0.2–1.0)
Monocytes Relative: 2 %
NEUTROS ABS: 13 10*3/uL — AB (ref 1.4–6.5)
Neutrophils Relative %: 88 %
Platelets: 322 10*3/uL (ref 150–440)
RBC: 4.47 MIL/uL (ref 4.40–5.90)
RDW: 13.5 % (ref 11.5–14.5)
WBC: 14.8 10*3/uL — AB (ref 3.8–10.6)

## 2016-04-05 IMAGING — CR DG CHEST 2V
2 series · 2 of 2 positions shown · non-contrast
Comparison: Chest radiograph performed [DATE]

CLINICAL DATA: Acute onset of cough and congestion. Generalized
chest tightness. Shortness of breath. Initial encounter.

EXAM:
CHEST  2 VIEW

[chest pa]
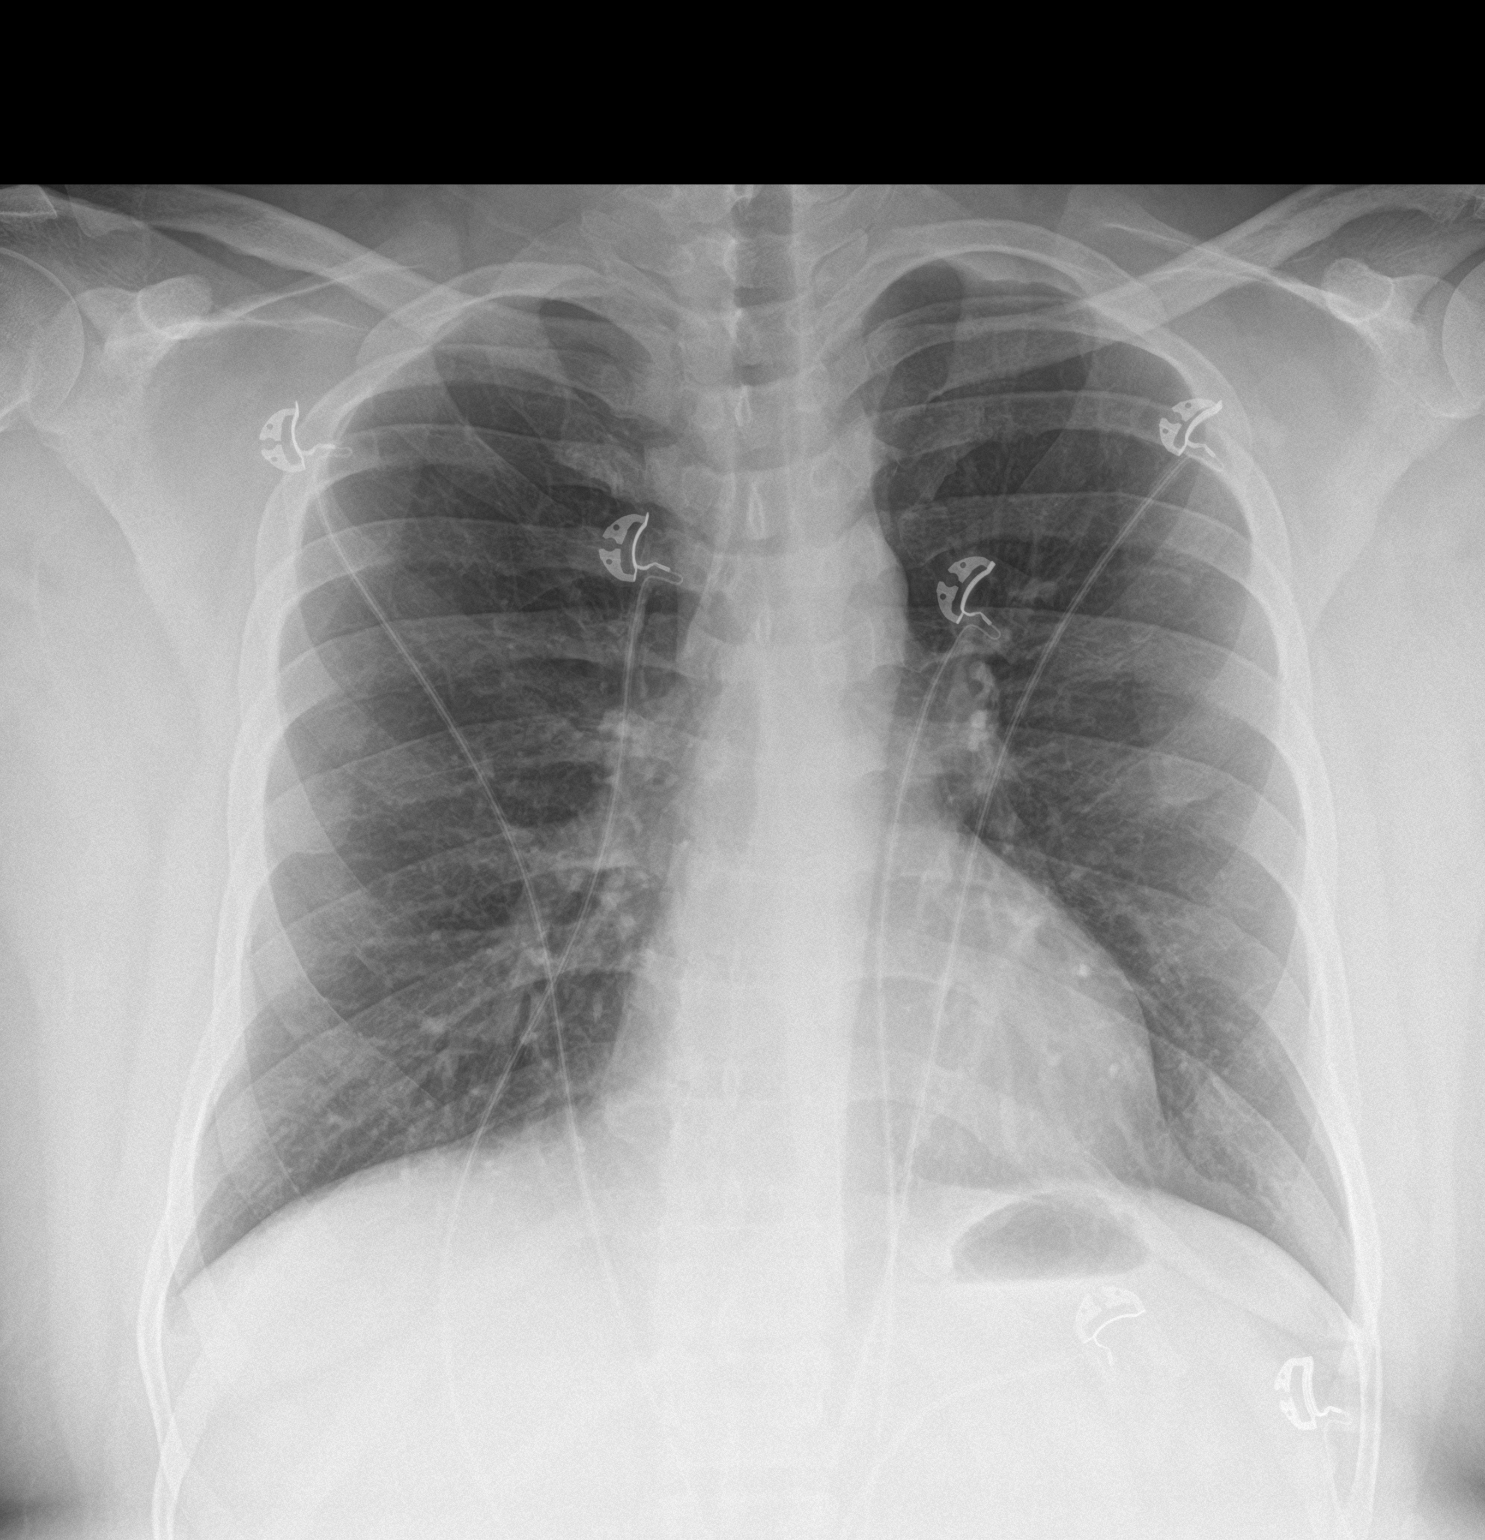

[chest lat]
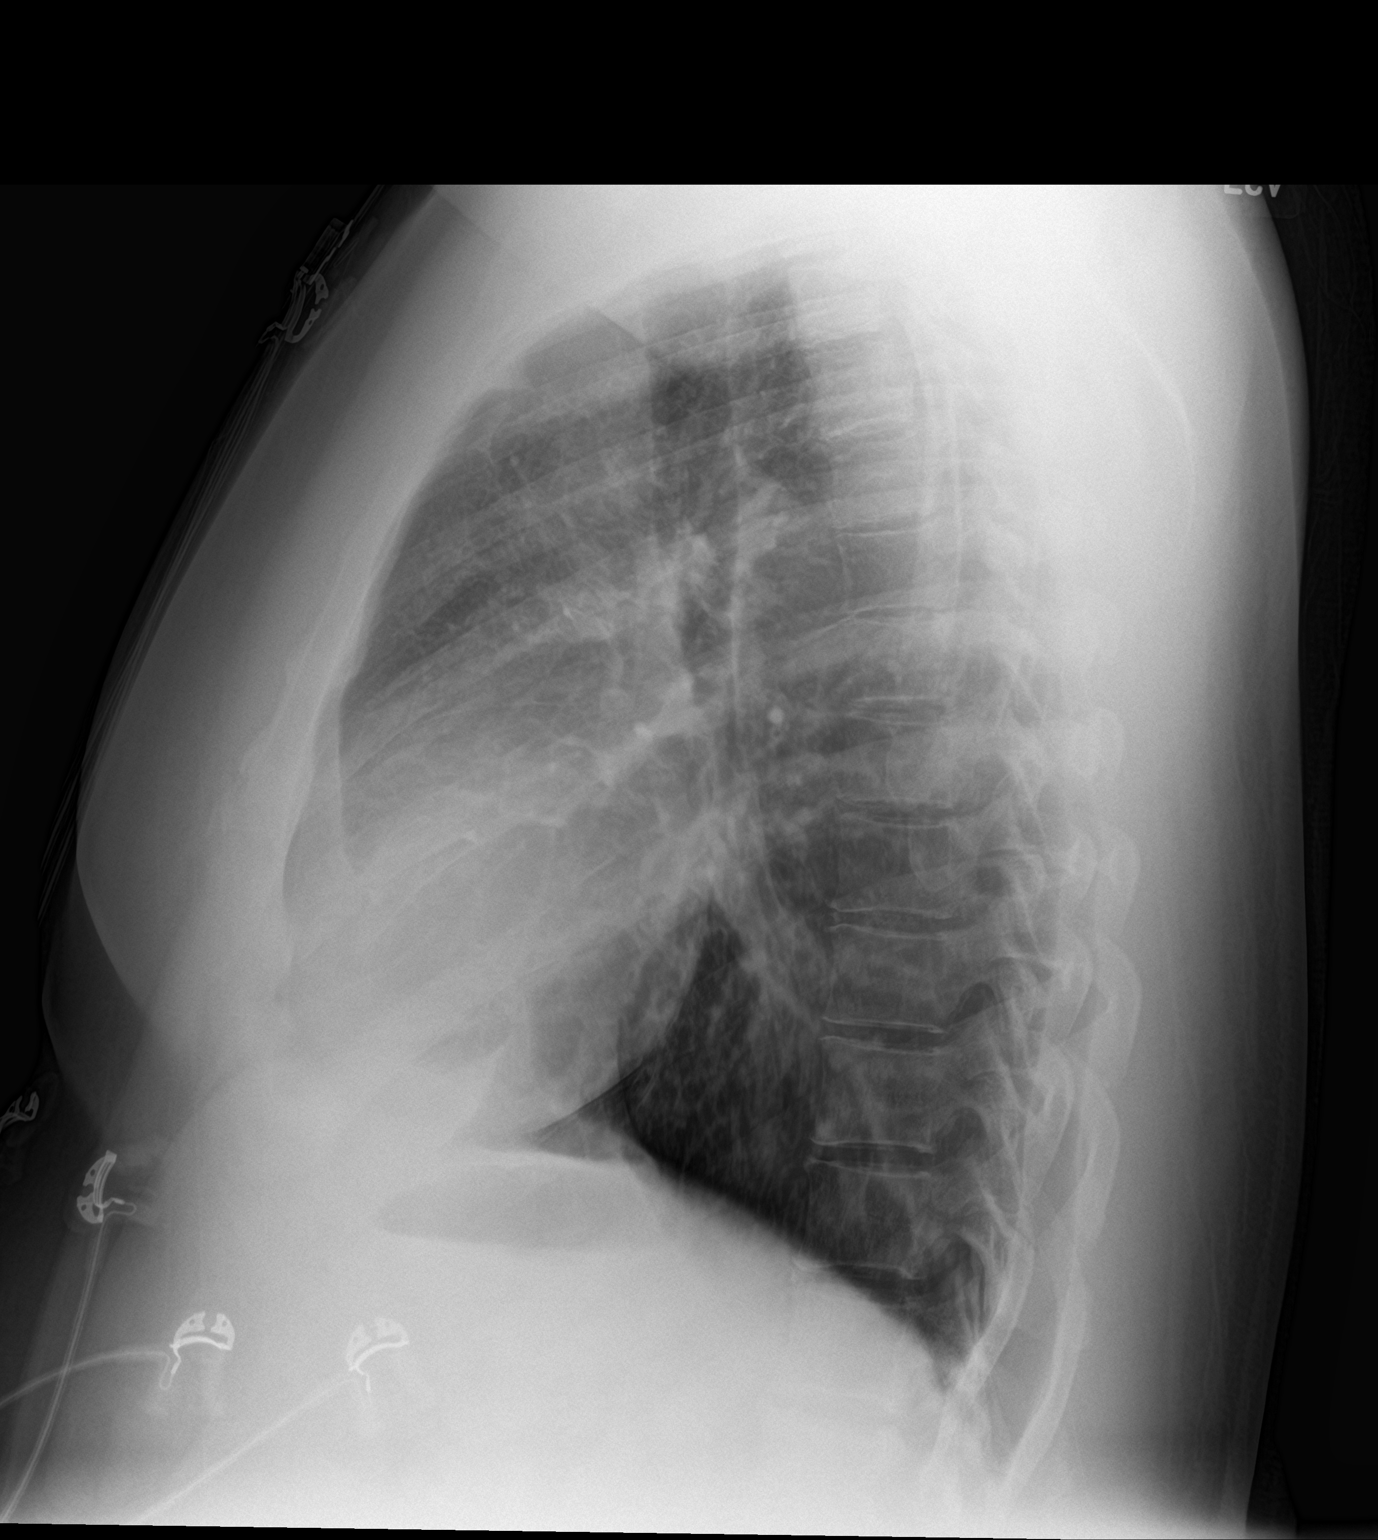

[2 of 2 positions shown; findings below may reference images not displayed]

FINDINGS: The lungs are well-aerated and clear. There is no evidence of focal
opacification, pleural effusion or pneumothorax.

The heart is normal in size; the mediastinal contour is within
normal limits. No acute osseous abnormalities are seen.
IMPRESSION: No acute cardiopulmonary process seen.

## 2016-04-05 MED ORDER — IPRATROPIUM-ALBUTEROL 0.5-2.5 (3) MG/3ML IN SOLN
3.0000 mL | Freq: Once | RESPIRATORY_TRACT | Status: AC
Start: 2016-04-05 — End: 2016-04-05
  Administered 2016-04-05: 3 mL via RESPIRATORY_TRACT
  Filled 2016-04-05: qty 3

## 2016-04-05 MED ORDER — ALBUTEROL SULFATE (2.5 MG/3ML) 0.083% IN NEBU
5.0000 mg | INHALATION_SOLUTION | Freq: Once | RESPIRATORY_TRACT | Status: AC
  Administered 2016-04-05: 5 mg via RESPIRATORY_TRACT

## 2016-04-05 MED ORDER — BISACODYL 5 MG PO TBEC
5.0000 mg | DELAYED_RELEASE_TABLET | Freq: Every day | ORAL | Status: DC | PRN
  Filled 2016-04-05: qty 1

## 2016-04-05 MED ORDER — METHYLPREDNISOLONE SODIUM SUCC 125 MG IJ SOLR
125.0000 mg | Freq: Once | INTRAMUSCULAR | Status: AC
  Administered 2016-04-05: 125 mg via INTRAVENOUS
  Filled 2016-04-05: qty 2

## 2016-04-05 MED ORDER — POTASSIUM CHLORIDE CRYS ER 20 MEQ PO TBCR
40.0000 meq | EXTENDED_RELEASE_TABLET | ORAL | Status: DC
  Administered 2016-04-06: 40 meq via ORAL
  Filled 2016-04-05: qty 2

## 2016-04-05 MED ORDER — BUDESONIDE 0.25 MG/2ML IN SUSP
0.2500 mg | Freq: Two times a day (BID) | RESPIRATORY_TRACT | Status: DC
  Administered 2016-04-06: 0.25 mg via RESPIRATORY_TRACT
  Filled 2016-04-05: qty 2

## 2016-04-05 MED ORDER — ACETAMINOPHEN 650 MG RE SUPP: 650.0000 mg | Freq: Four times a day (QID) | RECTAL | Status: DC | PRN

## 2016-04-05 MED ORDER — ONDANSETRON HCL 4 MG PO TABS: 4.0000 mg | ORAL_TABLET | Freq: Four times a day (QID) | ORAL | Status: DC | PRN

## 2016-04-05 MED ORDER — MONTELUKAST SODIUM 10 MG PO TABS
10.0000 mg | ORAL_TABLET | Freq: Every day | ORAL | Status: DC
  Administered 2016-04-05 – 2016-04-06 (×2): 10 mg via ORAL
  Filled 2016-04-05 (×2): qty 1

## 2016-04-05 MED ORDER — IPRATROPIUM-ALBUTEROL 0.5-2.5 (3) MG/3ML IN SOLN
3.0000 mL | Freq: Once | RESPIRATORY_TRACT | Status: AC
  Administered 2016-04-05: 3 mL via RESPIRATORY_TRACT
  Filled 2016-04-05: qty 3

## 2016-04-05 MED ORDER — ONDANSETRON HCL 4 MG/2ML IJ SOLN: 4.0000 mg | Freq: Four times a day (QID) | INTRAMUSCULAR | Status: DC | PRN

## 2016-04-05 MED ORDER — ALBUTEROL SULFATE (2.5 MG/3ML) 0.083% IN NEBU
INHALATION_SOLUTION | RESPIRATORY_TRACT | Status: AC
  Filled 2016-04-05: qty 12

## 2016-04-05 MED ORDER — DOCUSATE SODIUM 100 MG PO CAPS
100.0000 mg | ORAL_CAPSULE | Freq: Two times a day (BID) | ORAL | Status: DC
  Administered 2016-04-05 – 2016-04-07 (×4): 100 mg via ORAL
  Filled 2016-04-05 (×4): qty 1

## 2016-04-05 MED ORDER — ENOXAPARIN SODIUM 40 MG/0.4ML ~~LOC~~ SOLN
40.0000 mg | SUBCUTANEOUS | Status: DC
  Filled 2016-04-05 (×2): qty 0.4

## 2016-04-05 MED ORDER — SODIUM CHLORIDE 0.9 % IV SOLN
INTRAVENOUS | Status: DC
  Administered 2016-04-05: 23:00:00 via INTRAVENOUS

## 2016-04-05 MED ORDER — IPRATROPIUM-ALBUTEROL 0.5-2.5 (3) MG/3ML IN SOLN
3.0000 mL | RESPIRATORY_TRACT | Status: DC
  Administered 2016-04-06 – 2016-04-07 (×5): 3 mL via RESPIRATORY_TRACT
  Filled 2016-04-05 (×6): qty 3

## 2016-04-05 MED ORDER — ACETAMINOPHEN 325 MG PO TABS
650.0000 mg | ORAL_TABLET | Freq: Four times a day (QID) | ORAL | Status: DC | PRN
  Administered 2016-04-06 – 2016-04-07 (×2): 650 mg via ORAL
  Filled 2016-04-05 (×2): qty 2

## 2016-04-05 MED ORDER — ALBUTEROL SULFATE (2.5 MG/3ML) 0.083% IN NEBU
INHALATION_SOLUTION | RESPIRATORY_TRACT | Status: AC
  Filled 2016-04-05: qty 6

## 2016-04-05 MED ORDER — ALBUTEROL (5 MG/ML) CONTINUOUS INHALATION SOLN
15.0000 mg/h | INHALATION_SOLUTION | Freq: Once | RESPIRATORY_TRACT | Status: AC
  Administered 2016-04-05: 15 mg/h via RESPIRATORY_TRACT

## 2016-04-05 MED ORDER — METHYLPREDNISOLONE SODIUM SUCC 125 MG IJ SOLR
60.0000 mg | Freq: Four times a day (QID) | INTRAMUSCULAR | Status: DC
  Administered 2016-04-06: 60 mg via INTRAVENOUS
  Filled 2016-04-05: qty 2

## 2016-04-05 MED ORDER — MAGNESIUM SULFATE 2 GM/50ML IV SOLN
2.0000 g | Freq: Once | INTRAVENOUS | Status: AC
  Administered 2016-04-05: 2 g via INTRAVENOUS
  Filled 2016-04-05: qty 50

## 2016-04-05 NOTE — ED Triage Notes (Signed)
Pt to ED via POV for shortness of breath and wheezing. Pt states that he has been sick since 04/02/16. Pt has a hx/o asthma, took a breathing treatment and used inhaler at home without relief. Pt wheezing with increased work of breathing in triage.

## 2016-04-05 NOTE — ED Provider Notes (Signed)
[facility] Emergency Department Provider Note   ____________________________________________   I have reviewed the triage vital signs and the nursing notes.   HISTORY  Chief Complaint Shortness of Breath and Wheezing   History limited by: Not Limited   HPI Mark [name] is a 41 y.o. male who presents to the emergency department today with concerns for difficulty with breathing. The patient states that the symptoms have been going on for about 4 days. He does have a history of asthma. He states he has been trying his breathing treatments and inhaler at home without any significant relief. Since accompanied by some tightness and discomfort to the left side of his chest. Patient states he normally gets this with his asthma exacerbations. Apparently his primary care doctor has intact about putting him on inhaled steroids but that has not yet occurred. Patient denies any fevers.   Past Medical History:  Diagnosis Date  . Asthma     There are no active problems to display for this patient.   Past Surgical History:  Procedure Laterality Date  . HERNIA REPAIR      Prior to Admission medications   Medication Sig Start Date End Date Taking? Authorizing Provider  albuterol (PROVENTIL HFA;VENTOLIN HFA) 108 (90 Base) MCG/ACT inhaler Inhale 2 puffs into the lungs every 6 (six) hours as needed for wheezing or shortness of breath. 02/15/16   Governor Rooksebecca Lord, MD  beclomethasone (QVAR) 80 MCG/ACT inhaler Inhale 2 puffs into the lungs 2 (two) times daily.    Historical Provider, MD  montelukast (SINGULAIR) 10 MG tablet Take 10 mg by mouth at bedtime.    Historical Provider, MD  predniSONE (DELTASONE) 10 MG tablet 40mg  once daily for 4 more days, start tomorrow. 02/15/16   Governor Rooksebecca Lord, MD    Allergies Patient has no known allergies.  No family history on file.  Social History Social History  Substance Use Topics  . Smoking status: Never Smoker  . Smokeless tobacco:  Never Used  . Alcohol use No    Review of Systems  Constitutional: Negative for fever. Cardiovascular: Positive for chest pain. Respiratory: Positive for shortness of breath. Gastrointestinal: Negative for abdominal pain, vomiting and diarrhea. Neurological: Negative for headaches, focal weakness or numbness.  10-point ROS otherwise negative.  ____________________________________________   PHYSICAL EXAM:  VITAL SIGNS: ED Triage Vitals  Enc Vitals Group     BP 04/05/16 1602 135/75     Pulse Rate 04/05/16 1601 (!) 105     Resp 04/05/16 1601 (!) 22     Temp --      Temp src --      SpO2 04/05/16 1601 95 %     Weight --      Height --      Head Circumference --      Peak Flow --      Pain Score 04/05/16 1601 7   Constitutional: Alert and oriented.  Eyes: Conjunctivae are normal. Normal extraocular movements. ENT   Head: Normocephalic and atraumatic.   Nose: No congestion/rhinnorhea.   Mouth/Throat: Mucous membranes are moist.   Neck: No stridor. Hematological/Lymphatic/Immunilogical: No cervical lymphadenopathy. Cardiovascular: Normal rate, regular rhythm.  No murmurs, rubs, or gallops.  Respiratory: Slightly increased work of breathing. Poor air movement diffusely. Expiratory wheezing appreciated diffusely.  Gastrointestinal: Soft and non tender. No rebound. No guarding.  Genitourinary: Deferred Musculoskeletal: Normal range of motion in all extremities. No lower extremity edema. Neurologic:  Normal speech and language. No gross focal neurologic deficits  are appreciated.  Skin:  Skin is warm, dry and intact. No rash noted. Psychiatric: Mood and affect are normal. Speech and behavior are normal. Patient exhibits appropriate insight and judgment.  ____________________________________________    LABS (pertinent positives/negatives)  Labs Reviewed  CBC WITH DIFFERENTIAL/PLATELET - Abnormal; Notable for the following:       Result Value   WBC 14.8 (*)     Hemoglobin 12.3 (*)    HCT 39.0 (*)    MCHC 31.5 (*)    Neutro Abs 13.0 (*)    All other components within normal limits  BASIC METABOLIC PANEL - Abnormal; Notable for the following:    Potassium 3.2 (*)    Chloride 97 (*)    Glucose, Bld 177 (*)    All other components within normal limits     ____________________________________________   EKG  I, Phineas Semen, attending physician, personally viewed and interpreted this EKG  EKG Time: 1612 Rate: 111 Rhythm: sinus tachycardia Axis: normal Intervals: qtc 441 QRS: narrow ST changes: no st elevation Impression: abnormal ekg  ____________________________________________    RADIOLOGY  CXR   IMPRESSION:  No acute cardiopulmonary process seen.       ____________________________________________   PROCEDURES  Procedures  ____________________________________________   INITIAL IMPRESSION / ASSESSMENT AND PLAN / ED COURSE  Pertinent labs & imaging results that were available during my care of the patient were reviewed by me and considered in my medical decision making (see chart for details).  Patient presented to the emergency department today with concerns for asthma exacerbation. Initially patient did have diffuse wheezing and poor air movement. Patient was given multiple DuoNeb treatments in the emergency department as well as on mineral magnesium. While there was some improvement on the examination patient continued to have wheezing and increased respiratory effort. Will plan on admission to hospital service for further workup and evaluation.  ____________________________________________   FINAL CLINICAL IMPRESSION(S) / ED DIAGNOSES  Final diagnoses:  Exacerbation of asthma, unspecified asthma severity, unspecified whether persistent     Note: This dictation was prepared with Dragon dictation. Any transcriptional errors that result from this process are unintentional     Phineas Semen,  MD 04/05/16 2048

## 2016-04-05 NOTE — H&P (Signed)
[facility] [place] [facility]   PATIENT NAME: Mark [name]    MR#:  [id number]  DATE OF BIRTH:  11-Nov-1975  DATE OF ADMISSION:  04/05/2016  PRIMARY CARE PHYSICIAN: Mark [name]   REQUESTING/REFERRING PHYSICIAN: DR.Goodman  CHIEF COMPLAINT:sob   Chief Complaint  Patient presents with  . Shortness of Breath  . Wheezing    HISTORY OF PRESENT ILLNESS:  Mark [name]  is a 41 y.o. 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  PAST MEDICAL HISTORY:   Past Medical History:  Diagnosis Date  . Asthma     PAST SURGICAL HISTOIRY:   Past Surgical History:  Procedure Laterality Date  . HERNIA REPAIR      SOCIAL HISTORY:   Social History  Substance Use Topics  . Smoking status: Never Smoker  . Smokeless tobacco: Never Used  . Alcohol use No    FAMILY HISTORY:  No family history on file. Mother had asthma. DRUG ALLERGIES:  No Known Allergies  REVIEW OF SYSTEMS:  CONSTITUTIONAL: No fever, fatigue or weakness.  EYES: No blurred or double vision.  EARS, NOSE, AND THROAT: No tinnitus or ear pain.  RESPIRATORY: cough,sob ,wheezing for 4 days.  CARDIOVASCULAR: No chest pain, orthopnea, edema.  GASTROINTESTINAL: No nausea, vomiting, diarrhea or abdominal pain.  GENITOURINARY: No dysuria, hematuria.  ENDOCRINE: No polyuria, nocturia,  HEMATOLOGY: No anemia, easy bruising or bleeding SKIN: No rash or lesion. MUSCULOSKELETAL: No joint pain or arthritis.   NEUROLOGIC: No tingling, numbness, weakness.  PSYCHIATRY: No anxiety or depression.   MEDICATIONS AT HOME:   Prior to Admission medications   Medication Sig Start Date End  Date Taking? Authorizing Provider  albuterol (PROVENTIL HFA;VENTOLIN HFA) 108 (90 Base) MCG/ACT inhaler Inhale 2 puffs into the lungs every 6 (six) hours as needed for wheezing or shortness of breath. 02/15/16  Yes Mark Rooks, MD  beclomethasone (QVAR) 80 MCG/ACT inhaler Inhale 2 puffs into the lungs 2 (two) times daily.   Yes Historical Provider, MD  montelukast (SINGULAIR) 10 MG tablet Take 10 mg by mouth at bedtime.   Yes Historical Provider, MD  predniSONE (DELTASONE) 10 MG tablet 40mg  once daily for 4 more days, start tomorrow. Patient not taking: Reported on 04/05/2016 02/15/16   Mark Rooks, MD      VITAL SIGNS:  Blood pressure 140/70, pulse (!) 109, resp. rate (!) 22, SpO2 97 %.  PHYSICAL EXAMINATION:  GENERAL:  41 y.o.-year-old patient lying in the bed with no acute distress.  EYES: Pupils equal, round, reactive to light and accommodation. No scleral icterus. Extraocular muscles intact.  HEENT: Head atraumatic, normocephalic. Oropharynx and nasopharynx clear.  NECK:  Supple, no jugular venous distention. No thyroid enlargement, no tenderness.  LUNGS: bilateral expiratory  Wheeze in all lung fields, No use of accessory muscles of respiration.  CARDIOVASCULAR: S1, S2  Tachycardic, No murmurs, rubs, or gallops.  ABDOMEN: Soft, nontender, nondistended. Bowel sounds present. No organomegaly or mass.  EXTREMITIES: No pedal edema, cyanosis, or clubbing.  NEUROLOGIC: Cranial nerves II through XII are intact. Muscle strength 5/5 in all extremities. Sensation intact. Gait not checked.  PSYCHIATRIC: The  patient is alert and oriented x 3.  SKIN: No obvious rash, lesion, or ulcer.   LABORATORY PANEL:   CBC  Recent Labs Lab 04/05/16 1941  WBC 14.8*  HGB 12.3*  HCT 39.0*  PLT 322   ------------------------------------------------------------------------------------------------------------------  Chemistries   Recent Labs Lab 04/05/16 1941  NA 136  K 3.2*  CL 97*  CO2 26   GLUCOSE 177*  BUN 10  CREATININE 1.08  CALCIUM 8.9   ------------------------------------------------------------------------------------------------------------------  Cardiac Enzymes No results for input(s): TROPONINI in the last 168 hours. ------------------------------------------------------------------------------------------------------------------  RADIOLOGY:  Dg Chest 2 View  Result Date: 04/05/2016 CLINICAL DATA:  Acute onset of cough and congestion. Generalized chest tightness. Shortness of breath. Initial encounter. EXAM: CHEST  2 VIEW COMPARISON:  Chest radiograph performed 02/15/2016 FINDINGS: The lungs are well-aerated and clear. There is no evidence of focal opacification, pleural effusion or pneumothorax. The heart is normal in size; the mediastinal contour is within normal limits. No acute osseous abnormalities are seen. IMPRESSION: No acute cardiopulmonary process seen. Electronically Signed   By: Mark [name] M.D.   On: 04/05/2016 16:51    EKG:   Orders placed or performed during the hospital encounter of 04/05/16  . ED EKG  . ED EKG    IMPRESSION AND PLAN:   Acute respiratory failure with hypoxia due to acute asthma exacerbation,asthma likely triggered by dust exposure;continue o2,iv steroids,nebs ,and see how he does. 2.hypokalemia;replace k. GI and DVT prophylaxis. D/w wife.    All the records are reviewed and case discussed with ED provider. Management plans discussed with the patient, family and they are in agreement.  CODE STATUS:full  TOTAL TIME TAKING CARE OF THIS PATIENT: 55 minutes.    Katha HammingKONIDENA,Niajah [name] M.D on 04/05/2016 at 11:32 PM  Between 7am to 6pm - Pager - [phone]  After 6pm go to [email or web address] - password [email or web address]  [name]  Office  [phone]  CC: Primary care physician; Mark [name]  Note: This dictation was prepared with Dragon dictation along with smaller phrase technology. Any  transcriptional errors that result from this process are unintentional.

## 2016-04-06 LAB — BASIC METABOLIC PANEL
Anion gap: 8 (ref 5–15)
BUN: 12 mg/dL (ref 6–20)
CHLORIDE: 101 mmol/L (ref 101–111)
CO2: 26 mmol/L (ref 22–32)
CREATININE: 1.27 mg/dL — AB (ref 0.61–1.24)
Calcium: 9 mg/dL (ref 8.9–10.3)
GFR calc non Af Amer: >60 mL/min (ref >60)
Glucose, Bld: 222 mg/dL — ABNORMAL HIGH (ref 65–99)
Potassium: 3.5 mmol/L (ref 3.5–5.1)
Sodium: 135 mmol/L (ref 135–145)

## 2016-04-06 LAB — CBC
HEMATOCRIT: 36.2 % — AB (ref 40.0–52.0)
Hemoglobin: 11.7 g/dL — ABNORMAL LOW (ref 13.0–18.0)
MCH: 28.1 pg (ref 26.0–34.0)
MCHC: 32.3 g/dL (ref 32.0–36.0)
MCV: 87.1 fL (ref 80.0–100.0)
PLATELETS: 326 10*3/uL (ref 150–440)
RBC: 4.15 MIL/uL — AB (ref 4.40–5.90)
RDW: 13.6 % (ref 11.5–14.5)
WBC: 11.9 10*3/uL — ABNORMAL HIGH (ref 3.8–10.6)

## 2016-04-06 LAB — GLUCOSE, CAPILLARY
GLUCOSE-CAPILLARY: 227 mg/dL — AB (ref 65–99)
Glucose-Capillary: 151 mg/dL — ABNORMAL HIGH (ref 65–99)
Glucose-Capillary: 200 mg/dL — ABNORMAL HIGH (ref 65–99)

## 2016-04-06 MED ORDER — POTASSIUM CHLORIDE CRYS ER 20 MEQ PO TBCR
40.0000 meq | EXTENDED_RELEASE_TABLET | Freq: Once | ORAL | Status: AC
  Administered 2016-04-06: 40 meq via ORAL
  Filled 2016-04-06: qty 2

## 2016-04-06 MED ORDER — INSULIN ASPART 100 UNIT/ML ~~LOC~~ SOLN
0.0000 [IU] | Freq: Three times a day (TID) | SUBCUTANEOUS | Status: DC
  Administered 2016-04-06 – 2016-04-07 (×2): 2 [IU] via SUBCUTANEOUS
  Filled 2016-04-06: qty 1
  Filled 2016-04-06: qty 2

## 2016-04-06 MED ORDER — ORAL CARE MOUTH RINSE
15.0000 mL | Freq: Two times a day (BID) | OROMUCOSAL | Status: DC
  Administered 2016-04-06 (×2): 15 mL via OROMUCOSAL

## 2016-04-06 MED ORDER — CHLORHEXIDINE GLUCONATE 0.12 % MT SOLN
15.0000 mL | Freq: Two times a day (BID) | OROMUCOSAL | Status: DC
  Administered 2016-04-06 – 2016-04-07 (×2): 15 mL via OROMUCOSAL
  Filled 2016-04-06 (×3): qty 15

## 2016-04-06 MED ORDER — AZITHROMYCIN 500 MG PO TABS
500.0000 mg | ORAL_TABLET | Freq: Every day | ORAL | Status: AC
  Administered 2016-04-06: 500 mg via ORAL
  Filled 2016-04-06: qty 1

## 2016-04-06 MED ORDER — BUDESONIDE 0.5 MG/2ML IN SUSP
0.5000 mg | Freq: Two times a day (BID) | RESPIRATORY_TRACT | Status: DC
Start: 2016-04-06 — End: 2016-04-07
  Administered 2016-04-06 – 2016-04-07 (×2): 0.5 mg via RESPIRATORY_TRACT
  Filled 2016-04-06 (×2): qty 2

## 2016-04-06 MED ORDER — METHYLPREDNISOLONE SODIUM SUCC 40 MG IJ SOLR
40.0000 mg | Freq: Four times a day (QID) | INTRAMUSCULAR | Status: DC
  Administered 2016-04-06 – 2016-04-07 (×4): 40 mg via INTRAVENOUS
  Filled 2016-04-06 (×4): qty 1

## 2016-04-06 MED ORDER — AZITHROMYCIN 250 MG PO TABS
250.0000 mg | ORAL_TABLET | Freq: Every day | ORAL | Status: DC
  Administered 2016-04-07: 250 mg via ORAL
  Filled 2016-04-06: qty 1

## 2016-04-06 NOTE — Progress Notes (Signed)
A&O. Admitted with resp difficulty. Independent. On O2 at 2L. IV fluids infusing. IV solu-medrol given.

## 2016-04-06 NOTE — Progress Notes (Signed)
Inpatient Diabetes Program Recommendations  AACE/ADA: New Consensus Statement on Inpatient Glycemic Control (2015)  Target Ranges:  Prepandial:   less than 140 mg/dL      Peak postprandial:   less than 180 mg/dL (1-2 hours)      Critically ill patients:  140 - 180 mg/dL  Results for Harrie ForemanLEE, Eyad D (MRN [id number]) as of 04/06/2016 10:46  Ref. Range 04/06/2016 08:09  Glucose-Capillary Latest Ref Range: 65 - 99 mg/dL 782227 (H)   Results for Harrie ForemanLEE, Amond D (MRN [id number]) as of 04/06/2016 10:46  Ref. Range 04/05/2016 19:41 04/06/2016 04:38  Glucose Latest Ref Range: 65 - 99 mg/dL [id number] (H) [id number] (H)   Review of Glycemic Control  Diabetes history: No Outpatient Diabetes medications: NA Current orders for Inpatient glycemic control: None  Inpatient Diabetes Program Recommendations: Correction (SSI): While inpatient and ordered steroids, please consider ordering CBGs with Novolog correction scale ACHS.  Thanks, Orlando PennerMarie Trenda Corliss, RN, MSN, CDE Diabetes Coordinator Inpatient Diabetes Program [phone] (Team Pager from 8am to 5pm)

## 2016-04-06 NOTE — Progress Notes (Signed)
Patient ID: Mark [name] Grillot, male   DOB: 12/17/1975, 41 y.o.   MRN: [id number]  [facility] PROGRESS NOTE  Mark [name] Lamoureaux VHQ:[id number] DOB: 03/10/1975 DOA: 04/05/2016 PCP: [facility] [place]  HPI/Subjective: Patient feeling better than when he came in but still with some shortness of breath. Patient with some wheeze and cough.  Objective: Vitals:   04/06/16 0748 04/06/16 1134  BP: (!) 118/42 (!) 149/71  Pulse: (!) 105 95  Resp: 18 20  Temp: 98 F (36.7 C) 97.8 F (36.6 C)    Filed Weights   04/06/16 0119  Weight: 122.5 kg (270 lb)    ROS: Review of Systems  Constitutional: Negative for chills and fever.  Eyes: Negative for blurred vision.  Respiratory: Positive for cough, shortness of breath and wheezing.   Cardiovascular: Negative for chest pain.  Gastrointestinal: Negative for abdominal pain, constipation, diarrhea, nausea and vomiting.  Genitourinary: Negative for dysuria.  Musculoskeletal: Negative for joint pain.  Neurological: Negative for dizziness and headaches.   Exam: Physical Exam  Constitutional: He is oriented to person, place, and time.  HENT:  Nose: No mucosal edema.  Mouth/Throat: No oropharyngeal exudate or posterior oropharyngeal edema.  Eyes: Conjunctivae, EOM and lids are normal. Pupils are equal, round, and reactive to light.  Neck: No JVD present. Carotid bruit is not present. No edema present. No thyroid mass and no thyromegaly present.  Cardiovascular: S1 normal and S2 normal.  Tachycardia present.  Exam reveals no gallop.   No murmur heard. Pulses:      Dorsalis pedis pulses are 2+ on the right side, and 2+ on the left side.  Respiratory: No respiratory distress. He has decreased breath sounds in the right middle field, the right lower field, the left middle field and the left lower field. He has wheezes in the right upper field, the right middle field, the right lower field, the left upper field, the left middle field and the left lower  field. He has no rhonchi. He has no rales.  GI: Soft. Bowel sounds are normal. There is no tenderness.  Musculoskeletal:       Right ankle: He exhibits swelling.       Left ankle: He exhibits swelling.  Lymphadenopathy:    He has no cervical adenopathy.  Neurological: He is alert and oriented to person, place, and time. No cranial nerve deficit.  Skin: Skin is warm. No rash noted. Nails show no clubbing.  Psychiatric: He has a normal mood and affect.      Data Reviewed: Basic Metabolic Panel:  Recent Labs Lab 04/05/16 1941 04/06/16 0438  NA 136 135  K 3.2* 3.5  CL 97* 101  CO2 26 26  GLUCOSE 177* 222*  BUN 10 12  CREATININE 1.08 1.27*  CALCIUM 8.9 9.0   CBC:  Recent Labs Lab 04/05/16 1941 04/06/16 0438  WBC 14.8* 11.9*  NEUTROABS 13.0*  --   HGB 12.3* 11.7*  HCT 39.0* 36.2*  MCV 87.2 87.1  PLT 322 326    CBG:  Recent Labs Lab 04/06/16 0809  GLUCAP 227*    Studies: Dg Chest 2 View  Result Date: 04/05/2016 CLINICAL DATA:  Acute onset of cough and congestion. Generalized chest tightness. Shortness of breath. Initial encounter. EXAM: CHEST  2 VIEW COMPARISON:  Chest radiograph performed 02/15/2016 FINDINGS: The lungs are well-aerated and clear. There is no evidence of focal opacification, pleural effusion or pneumothorax. The heart is normal in size; the mediastinal contour is within normal limits.  No acute osseous abnormalities are seen. IMPRESSION: No acute cardiopulmonary process seen. Electronically Signed   By: Roanna Raider M.[name].   On: 04/05/2016 16:51    Scheduled Meds: . budesonide (PULMICORT) nebulizer solution  0.5 mg Nebulization BID  . chlorhexidine  15 mL Mouth Rinse BID  . docusate sodium  100 mg Oral BID  . enoxaparin (LOVENOX) injection  40 mg Subcutaneous Q24H  . insulin aspart  0-9 Units Subcutaneous TID WC  . ipratropium-albuterol  3 mL Nebulization Q4H  . mouth rinse  15 mL Mouth Rinse q12n4p  . methylPREDNISolone (SOLU-MEDROL)  injection  40 mg Intravenous Q6H  . montelukast  10 mg Oral QHS    Assessment/Plan:  1. Acute respiratory failure with hypoxia as documented by admitting physician. We were able to taper off oxygen today. 2. Asthma exacerbation. Patient still with diffuse wheezing. Continue Solu-Medrol 40 mg IV every 6 hours, budesonide nebulizers and DuoNeb nebulizers. Continue Singulair. And Zithromax 3. Impaired fasting glucose. Check a hemoglobin A1c. Sliding scale while on steroids 4. Hypokalemia replace potassium  Code Status:     Code Status Orders        Start     Ordered   04/05/16 2053  Full code  Continuous     04/05/16 2054    Code Status History    Date Active Date Inactive Code Status Order ID Comments User Context   This patient has a current code status but no historical code status.    Advance Directive Documentation   Flowsheet Row Most Recent Value  Type of Advance Directive  Out of facility DNR (pink MOST or yellow form)  Pre-existing out of facility DNR order (yellow form or pink MOST form)  No data  "MOST" Form in Place?  No data      Disposition Plan: Home once breathing better  Antibiotics:  Zithromax  Time spent: 28 minutes  Alford Highland  [employer]

## 2016-04-06 NOTE — Care Management (Signed)
Admitted to [facility] with the diagnosis of acute respiratory failure. Lives with wife, Raquel SarnaSherika, and 2 children that are in the home ages 7412 & 6. Works at [employer] on the other side of [place] x 15 years. Sees Dr. Lurlean LeydenSionne at [facility] in [place] last November 2017. No home health, skilled nursing, or oxygen. Does have nebulizer in the home. [facility] and [facility] in [place]. Takes care of all basic and instrumental activities of daily living himself, drives. No falls. Good appetite. Sister or wife will transport. Possible D/C 04/07/16 per Dr. Renae GlossWieting. Gwenette GreetBrenda S Derinda Bartus RN MSN CCM Care Management

## 2016-04-06 NOTE — Progress Notes (Signed)
Breathing improved,weaned off oxygen,iv steriods continue.

## 2016-04-07 LAB — HEMOGLOBIN A1C
Hgb A1c MFr Bld: 5.6 % (ref 4.8–5.6)
MEAN PLASMA GLUCOSE: 114 mg/dL

## 2016-04-07 LAB — GLUCOSE, CAPILLARY: GLUCOSE-CAPILLARY: 163 mg/dL — AB (ref 65–99)

## 2016-04-07 LAB — HIV ANTIBODY (ROUTINE TESTING W REFLEX): HIV Screen 4th Generation wRfx: NONREACTIVE

## 2016-04-07 MED ORDER — PREDNISONE 5 MG PO TABS: ORAL_TABLET | ORAL | 0 refills | Status: DC

## 2016-04-07 MED ORDER — PREDNISONE 20 MG PO TABS
40.0000 mg | ORAL_TABLET | Freq: Once | ORAL | Status: AC
  Administered 2016-04-07: 40 mg via ORAL
  Filled 2016-04-07: qty 2

## 2016-04-07 MED ORDER — AZITHROMYCIN 250 MG PO TABS: ORAL_TABLET | ORAL | 0 refills | Status: DC

## 2016-04-07 MED ORDER — BECLOMETHASONE DIPROPIONATE 80 MCG/ACT IN AERS: 2.0000 | INHALATION_SPRAY | Freq: Two times a day (BID) | RESPIRATORY_TRACT | 0 refills | Status: DC

## 2016-04-07 NOTE — Progress Notes (Signed)
Patient ID: [name], male   DOB: 12/06/1975, 41 y.o.   MRN: [id number] [facility] - [place] at [facility]        Mark [name] was admitted to the Hospital on 04/05/2016 and Discharged  04/07/2016 and should be excused from work/school   for 5 days starting 04/05/2016 , may return to work/school without any restrictions.  Mark HighlandWIETING, Mark [name] M.D on 04/07/2016,at 7:44 AM  [facility] - [place] [facility]    Office  [phone]

## 2016-04-07 NOTE — Discharge Instructions (Signed)
Asthma, Acute Bronchospasm °Acute bronchospasm caused by asthma is also referred to as an asthma attack. Bronchospasm means your air passages become narrowed. The narrowing is caused by inflammation and tightening of the muscles in the air tubes (bronchi) in your lungs. This can make it hard to breathe or cause you to wheeze and cough. °What are the causes? °Possible triggers are: °· Animal dander from the skin, hair, or feathers of animals. °· Dust mites contained in house dust. °· Cockroaches. °· Pollen from trees or grass. °· Mold. °· Cigarette or tobacco smoke. °· Air pollutants such as dust, household cleaners, hair sprays, aerosol sprays, paint fumes, strong chemicals, or strong odors. °· Cold air or weather changes. Cold air may trigger inflammation. Winds increase molds and pollens in the air. °· Strong emotions such as crying or laughing hard. °· Stress. °· Certain medicines such as aspirin or beta-blockers. °· Sulfites in foods and drinks, such as dried fruits and wine. °· Infections or inflammatory conditions, such as a flu, cold, or inflammation of the nasal membranes (rhinitis). °· Gastroesophageal reflux disease (GERD). GERD is a condition where stomach acid backs up into your esophagus. °· Exercise or strenuous activity. ° °What are the signs or symptoms? °· Wheezing. °· Excessive coughing, particularly at night. °· Chest tightness. °· Shortness of breath. °How is this diagnosed? °Your health care provider will ask you about your medical history and perform a physical exam. A chest X-ray or blood testing may be performed to look for other causes of your symptoms or other conditions that may have triggered your asthma attack. °How is this treated? °Treatment is aimed at reducing inflammation and opening up the airways in your lungs. Most asthma attacks are treated with inhaled medicines. These include quick relief or rescue medicines (such as bronchodilators) and controller medicines (such as inhaled  corticosteroids). These medicines are sometimes given through an inhaler or a nebulizer. Systemic steroid medicine taken by mouth or given through an IV tube also can be used to reduce the inflammation when an attack is moderate or severe. Antibiotic medicines are only used if a bacterial infection is present. °Follow these instructions at home: °· Rest. °· Drink plenty of liquids. This helps the mucus to remain thin and be easily coughed up. Only use caffeine in moderation and do not use alcohol until you have recovered from your illness. °· Do not smoke. Avoid being exposed to secondhand smoke. °· You play a critical role in keeping yourself in good health. Avoid exposure to things that cause you to wheeze or to have breathing problems. °· Keep your medicines up-to-date and available. Carefully follow your health care provider’s treatment plan. °· Take your medicine exactly as prescribed. °· When pollen or pollution is bad, keep windows closed and use an air conditioner or go to places with air conditioning. °· Asthma requires careful medical care. See your health care provider for a follow-up as advised. If you are more than [redacted] weeks pregnant and you were prescribed any new medicines, let your obstetrician know about the visit and how you are doing. Follow up with your health care provider as directed. °· After you have recovered from your asthma attack, make an appointment with your outpatient doctor to talk about ways to reduce the likelihood of future attacks. If you do not have a doctor who manages your asthma, make an appointment with a primary care doctor to discuss your asthma. °Get help right away if: °· You are getting worse. °·   You have trouble breathing. If severe, call your local emergency services (911 in the [place] °· You develop chest pain or discomfort. °· You are vomiting. °· You are not able to keep fluids down. °· You are coughing up yellow, green, brown, or bloody sputum. °· You have a fever  and your symptoms suddenly get worse. °· You have trouble swallowing. °This information is not intended to replace advice given to you by your health care provider. Make sure you discuss any questions you have with your health care provider. °Document Released: 05/20/2006 Document Revised: 07/17/2015 Document Reviewed: 08/10/2012 °Elsevier Interactive Patient Education © 2017 Elsevier Inc. ° °

## 2016-04-07 NOTE — Discharge Summary (Signed)
[facility] - [place] at [facility]   PATIENT NAME: Mark [name]    MR#:  [id number]  DATE OF BIRTH:  04/23/1975  DATE OF ADMISSION:  04/05/2016 ADMITTING PHYSICIAN: Katha HammingSnehalatha Konidena, MD  DATE OF DISCHARGE: 04/07/2016 10:56 AM  PRIMARY CARE PHYSICIAN: [facility] [place]    ADMISSION DIAGNOSIS:  Exacerbation of asthma, unspecified asthma severity, unspecified whether persistent [J45.901]  DISCHARGE DIAGNOSIS:  Active Problems:   Acute respiratory failure with hypoxemia (HCC)   SECONDARY DIAGNOSIS:   Past Medical History:  Diagnosis Date  . Asthma     HOSPITAL COURSE:   1. Acute respiratory failure with hypoxia as documented by admitting physician. Patient was quickly tapered off oxygen. 2. Asthma exacerbation. Patient had diffuse wheezing during the hospital course and patient was on high-dose Solu-Medrol dropped down to medium dose Solu-Medrol. Lungs were clear upon discharge home. He will be sent home on prednisone taper. I added a Z-Pak during the hospital course and he will finish up Zithromax as outpatient. During the hospital course he was given budesonide and DuoNeb nebulizers. He has albuterol inhaler at home and I prescribed Qvar at home. 3. Impaired fasting glucose. Hemoglobin A1c 5.6. Patient is not a diabetic. Sugars were high while on steroids. 4. Hypokalemia replaced during the hospital course  DISCHARGE CONDITIONS:   Satisfactory  CONSULTS OBTAINED:   none  DRUG ALLERGIES:  No Known Allergies  DISCHARGE MEDICATIONS:   Discharge Medication List as of 04/07/2016 10:06 AM    START taking these medications   Details  azithromycin (ZITHROMAX) 250 MG tablet One tab daily for three days, Print      CONTINUE these medications which have CHANGED   Details  beclomethasone (QVAR) 80 MCG/ACT inhaler Inhale 2 puffs into the lungs 2 (two) times daily., Starting Tue 04/07/2016, Print    predniSONE (DELTASONE) 5 MG tablet 6 tabs po day1; 5  tabs po day2; 4 tabs po day3; 3 tabs po day4; 2 tabs po day5; 1 tab po day6, Print      CONTINUE these medications which have NOT CHANGED   Details  albuterol (PROVENTIL HFA;VENTOLIN HFA) 108 (90 Base) MCG/ACT inhaler Inhale 2 puffs into the lungs every 6 (six) hours as needed for wheezing or shortness of breath., Starting Sat 02/15/2016, Print    montelukast (SINGULAIR) 10 MG tablet Take 10 mg by mouth at bedtime., Historical Med         DISCHARGE INSTRUCTIONS:   Follow-up PMD one week  If you experience worsening of your admission symptoms, develop shortness of breath, life threatening emergency, suicidal or homicidal thoughts you must seek medical attention immediately by calling 911 or calling your MD immediately  if symptoms less severe.  You Must read complete instructions/literature along with all the possible adverse reactions/side effects for all the Medicines you take and that have been prescribed to you. Take any new Medicines after you have completely understood and accept all the possible adverse reactions/side effects.   Please note  You were cared for by a hospitalist during your hospital stay. If you have any questions about your discharge medications or the care you received while you were in the hospital after you are discharged, you can call the unit and asked to speak with the hospitalist on call if the hospitalist that took care of you is not available. Once you are discharged, your primary care physician will handle any further medical issues. Please note that NO REFILLS for any discharge medications will be authorized  once you are discharged, as it is imperative that you return to your primary care physician (or establish a relationship with a primary care physician if you do not have one) for your aftercare needs so that they can reassess your need for medications and monitor your lab values.    Today   CHIEF COMPLAINT:   Chief Complaint  Patient presents with   . Shortness of Breath  . Wheezing    HISTORY OF PRESENT ILLNESS:  Mark [name]  is a 41 y.o. male with a known history of Asthma presented with shortness of breath and wheezing   VITAL SIGNS:  Blood pressure 129/82, pulse 94, temperature 98.1 F (36.7 C), temperature source Oral, resp. rate 18, height 5\' 10"  (1.778 m), weight 120.7 kg (266 lb), SpO2 95 %.   PHYSICAL EXAMINATION:  GENERAL:  41 y.o.-year-old patient lying in the bed with no acute distress.  EYES: Pupils equal, round, reactive to light and accommodation. No scleral icterus. Extraocular muscles intact.  HEENT: Head atraumatic, normocephalic. Oropharynx and nasopharynx clear.  NECK:  Supple, no jugular venous distention. No thyroid enlargement, no tenderness.  LUNGS: Normal breath sounds bilaterally, no wheezing, rales,rhonchi or crepitation. No use of accessory muscles of respiration.  CARDIOVASCULAR: S1, S2 normal. No murmurs, rubs, or gallops.  ABDOMEN: Soft, non-tender, non-distended. Bowel sounds present. No organomegaly or mass.  EXTREMITIES: No pedal edema, cyanosis, or clubbing.  NEUROLOGIC: Cranial nerves II through XII are intact. Muscle strength 5/5 in all extremities. Sensation intact. Gait not checked.  PSYCHIATRIC: The patient is alert and oriented x 3.  SKIN: No obvious rash, lesion, or ulcer.   DATA REVIEW:   CBC  Recent Labs Lab 04/06/16 0438  WBC 11.9*  HGB 11.7*  HCT 36.2*  PLT 326    Chemistries   Recent Labs Lab 04/06/16 0438  NA 135  K 3.5  CL 101  CO2 26  GLUCOSE 222*  BUN 12  CREATININE 1.27*  CALCIUM 9.0       Management plans discussed with the patient, family and they are in agreement.  CODE STATUS:  Code Status History    Date Active Date Inactive Code Status Order ID Comments User Context   04/05/2016  8:54 PM 04/07/2016  2:01 PM Full Code [id number]  Katha Hamming, MD ED    Advance Directive Documentation   Flowsheet Row Most Recent Value  Type of Advance  Directive  Out of facility DNR (pink MOST or yellow form)  Pre-existing out of facility DNR order (yellow form or pink MOST form)  No data  "MOST" Form in Place?  No data      TOTAL TIME TAKING CARE OF THIS PATIENT: 35 minutes.    Alford Highland M.D on 04/07/2016 at 4:52 PM  Between 7am to 6pm - Pager - [phone]  After 6pm go to [email or web address] - Social research officer, government  [employer] [facility] Office  [phone]  CC: Primary care physician; [facility] [place]

## 2016-04-07 NOTE — Progress Notes (Signed)
Patient discharged home as ordered,instructions explained and well understood,follow up appointment made,vital signs within normal limits,prescriptions given,escorted by volunteer person via wheelchair

## 2016-06-08 ENCOUNTER — Encounter: Payer: Self-pay | Admitting: *Deleted

## 2016-06-08 ENCOUNTER — Ambulatory Visit
Admission: EM | Admit: 2016-06-08 | Discharge: 2016-06-08 | Disposition: A | Discharge status: Home / Self Care | Payer: PRIVATE HEALTH INSURANCE | Attending: Family Medicine | Admitting: Family Medicine

## 2016-06-08 DIAGNOSIS — H6981 Other specified disorders of Eustachian tube, right ear: Secondary | ICD-10-CM | POA: Diagnosis not present

## 2016-06-08 DIAGNOSIS — H60391 Other infective otitis externa, right ear: Secondary | ICD-10-CM

## 2016-06-08 MED ORDER — CIPROFLOXACIN-DEXAMETHASONE 0.3-0.1 % OT SUSP: 4.0000 [drp] | Freq: Two times a day (BID) | OTIC | 0 refills | Status: DC

## 2016-06-08 NOTE — ED Triage Notes (Signed)
Right ear pain since yesterday. Possible fever. Denies drainage.

## 2016-06-08 NOTE — ED Provider Notes (Signed)
CSN: [id number]     Arrival date & time 06/08/16  1542 History   First MD Initiated Contact with Patient 06/08/16 1707     Chief Complaint  Patient presents with  . Otalgia  . Fever   (Consider location/radiation/quality/duration/timing/severity/associated sxs/prior Treatment) HPI  40 year old male who presents with right-sided ear pain that he's had since yesterday. Denies any drainage but has noticed some postnasal type drainage in his throat. He has been using a gauze pad to swab the ear out since no Q tips were available. He feels pain also down the side of his jaw into his throat. He has not Had any fever or chills; his ears have not been popping.Marland Kitchen He denies any cough.       Past Medical History:  Diagnosis Date  . Asthma    Past Surgical History:  Procedure Laterality Date  . HERNIA REPAIR     Family History  Problem Relation Age of Onset  . Asthma Mother    Social History  Substance Use Topics  . Smoking status: Never Smoker  . Smokeless tobacco: Never Used  . Alcohol use No    Review of Systems  Constitutional: Negative for activity change, chills, fatigue and fever.  HENT: Positive for congestion, ear pain and postnasal drip. Negative for ear discharge.   All other systems reviewed and are negative.   Allergies  Patient has no known allergies.  Home Medications   Prior to Admission medications   Medication Sig Start Date End Date Taking? Authorizing Provider  albuterol (PROVENTIL HFA;VENTOLIN HFA) 108 (90 Base) MCG/ACT inhaler Inhale 2 puffs into the lungs every 6 (six) hours as needed for wheezing or shortness of breath. 02/15/16  Yes Governor Rooks, MD  montelukast (SINGULAIR) 10 MG tablet Take 10 mg by mouth at bedtime.   Yes Historical Provider, MD  ciprofloxacin-dexamethasone (CIPRODEX) otic suspension Place 4 drops into both ears 2 (two) times daily. 06/08/16   Lutricia Feil, PA-C   Meds Ordered and Administered this Visit  Medications - No data to  display  BP (!) 146/104 (BP Location: Left Arm)   Pulse 70   Temp 98.1 F (36.7 C) (Oral)   Resp 16   Ht  (1.753 m)   Wt 260 lb (117.9 kg)   SpO2 98%   BMI 38.40 kg/m  No data found.   Physical Exam  Constitutional: He is oriented to person, place, and time. He appears well-developed and well-nourished. No distress.  HENT:  Head: Normocephalic and atraumatic.  Left Ear: External ear normal.  Nose: Nose normal.  Mouth/Throat: Oropharynx is clear and moist. No oropharyngeal exudate.  Examination of his right ear shows irritation/ abrasion of the ear canal with narrowing from swelling. TM is barely visible but does not appear to be inflamed. Patient does have tenderness over the tragus and along the jaw extending down into the throat along the jaw line. This reproduces his pain.  Eyes: Pupils are equal, round, and reactive to light. Right eye exhibits no discharge. Left eye exhibits no discharge.  Neck: Normal range of motion.  Musculoskeletal: Normal range of motion.  Lymphadenopathy:    He has no cervical adenopathy.  Neurological: He is alert and oriented to person, place, and time.  Skin: Skin is warm and dry. He is not diaphoretic.  Psychiatric: He has a normal mood and affect. His behavior is normal. Judgment and thought content normal.  Nursing note and vitals reviewed.   Urgent Care Course  Procedures (including critical care time)  Labs Review Labs Reviewed - No data to display  Imaging Review No results found.   Visual Acuity Review  Right Eye Distance:   Left Eye Distance:   Bilateral Distance:    Right Eye Near:   Left Eye Near:    Bilateral Near:         MDM   1. Eustachian tube dysfunction, right   2. Other infective acute otitis externa of right ear    Discharge Medication List as of 06/08/2016  5:23 PM    Plan: 1. Test/x-ray results and diagnosis reviewed with patient 2. rx as per orders; risks, benefits, potential side effects  reviewed with patient 3. Recommend supportive treatment with Attempting to use gauze in his ear which is roughening up the nail causing inflammation and swelling. I also recommended he start using Flonase on a daily basis for these next 4 weeks. If he is not improving he should follow-up with his primary care physician. 4. F/u prn if symptoms worsen or don't improve     Lutricia Feil, PA-C 06/08/16 1734    [address] Phillis Knack, PA-C 06/08/16 1734

## 2016-06-08 NOTE — Discharge Instructions (Signed)
Use Flonase daily for at least 4 weeks.

## 2017-09-02 ENCOUNTER — Encounter: Payer: Self-pay | Admitting: Emergency Medicine

## 2017-09-02 ENCOUNTER — Other Ambulatory Visit: Payer: Self-pay

## 2017-09-02 ENCOUNTER — Emergency Department
Admission: EM | Admit: 2017-09-02 | Discharge: 2017-09-02 | Disposition: A | Discharge status: Home / Self Care | Payer: PRIVATE HEALTH INSURANCE | Attending: Emergency Medicine | Admitting: Emergency Medicine

## 2017-09-02 DIAGNOSIS — M79601 Pain in right arm: Secondary | ICD-10-CM | POA: Insufficient documentation

## 2017-09-02 DIAGNOSIS — Z5321 Procedure and treatment not carried out due to patient leaving prior to being seen by health care provider: Secondary | ICD-10-CM | POA: Insufficient documentation

## 2017-09-02 NOTE — ED Notes (Addendum)
This RN at bedside to answer call light. Pt very irritated, loudly stating "I asked for a blanket 20 minutes ago!". This RN apologized for the delay and informed the pt that 2 very critically ill pt's had arrived by EMS back-to-back within the last several minutes that required the attention of several nursing and ED staff. Pt was seen walking out of room less than 10 seconds after this RN walked out to get a blanket.

## 2017-09-02 NOTE — ED Notes (Signed)
Pt to STAT desk requesting to speak with supervisor; charge nurse notified

## 2017-09-02 NOTE — ED Triage Notes (Addendum)
Patient to ER for c/o right arm pain and decreased movement x2 days. 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 States he took Tylenol at approx 1900, which helped him sleep, but then pain was worse when he woke up. Equal grips bilaterally. No neuro deficits. Patient is able to extend arm, but states that it is painful. States pain began at upper arm initially, but feels more painful at lower arm into hand now.

## 2021-03-10 ENCOUNTER — Other Ambulatory Visit: Payer: Self-pay

## 2021-03-10 ENCOUNTER — Emergency Department
Admission: EM | Admit: 2021-03-10 | Discharge: 2021-03-10 | Disposition: A | Discharge status: Home / Self Care | Payer: BLUE CROSS/BLUE SHIELD | Attending: Emergency Medicine | Admitting: Emergency Medicine

## 2021-03-10 ENCOUNTER — Emergency Department: Payer: BLUE CROSS/BLUE SHIELD

## 2021-03-10 DIAGNOSIS — Y92129 Unspecified place in nursing home as the place of occurrence of the external cause: Secondary | ICD-10-CM | POA: Diagnosis not present

## 2021-03-10 DIAGNOSIS — R9402 Abnormal brain scan: Secondary | ICD-10-CM | POA: Diagnosis not present

## 2021-03-10 DIAGNOSIS — J45909 Unspecified asthma, uncomplicated: Secondary | ICD-10-CM | POA: Insufficient documentation

## 2021-03-10 DIAGNOSIS — R9089 Other abnormal findings on diagnostic imaging of central nervous system: Secondary | ICD-10-CM

## 2021-03-10 DIAGNOSIS — X58XXXA Exposure to other specified factors, initial encounter: Secondary | ICD-10-CM | POA: Diagnosis not present

## 2021-03-10 DIAGNOSIS — E1165 Type 2 diabetes mellitus with hyperglycemia: Secondary | ICD-10-CM | POA: Diagnosis not present

## 2021-03-10 DIAGNOSIS — S43402A Unspecified sprain of left shoulder joint, initial encounter: Secondary | ICD-10-CM | POA: Diagnosis not present

## 2021-03-10 DIAGNOSIS — I10 Essential (primary) hypertension: Secondary | ICD-10-CM | POA: Diagnosis not present

## 2021-03-10 DIAGNOSIS — R52 Pain, unspecified: Secondary | ICD-10-CM

## 2021-03-10 DIAGNOSIS — S4992XA Unspecified injury of left shoulder and upper arm, initial encounter: Secondary | ICD-10-CM | POA: Diagnosis present

## 2021-03-10 LAB — CBC
HCT: 37.6 % — ABNORMAL LOW (ref 39.0–52.0)
Hemoglobin: 11.6 g/dL — ABNORMAL LOW (ref 13.0–17.0)
MCH: 26.9 pg (ref 26.0–34.0)
MCHC: 30.9 g/dL (ref 30.0–36.0)
MCV: 87 fL (ref 80.0–100.0)
Platelets: 359 10*3/uL (ref 150–400)
RBC: 4.32 MIL/uL (ref 4.22–5.81)
RDW: 13 % (ref 11.5–15.5)
WBC: 12.7 10*3/uL — ABNORMAL HIGH (ref 4.0–10.5)
nRBC: 0 % (ref 0.0–0.2)

## 2021-03-10 LAB — BASIC METABOLIC PANEL
Anion gap: 6 (ref 5–15)
BUN: 13 mg/dL (ref 6–20)
CO2: 30 mmol/L (ref 22–32)
Calcium: 9.2 mg/dL (ref 8.9–10.3)
Chloride: 100 mmol/L (ref 98–111)
Creatinine, Ser: 0.99 mg/dL (ref 0.61–1.24)
GFR, Estimated: >60 mL/min (ref >60)
Glucose, Bld: 184 mg/dL — ABNORMAL HIGH (ref 70–99)
Potassium: 4.3 mmol/L (ref 3.5–5.1)
Sodium: 136 mmol/L (ref 135–145)

## 2021-03-10 LAB — TROPONIN I (HIGH SENSITIVITY): Troponin I (High Sensitivity): 4 ng/L (ref <18)

## 2021-03-10 IMAGING — MR MR HEAD W/O CM
12 series · 40 of 48 positions shown · non-contrast
Comparison: Cervical spine CT [5H] hours today.

CLINICAL DATA: 45-year-old male with headache. Pain radiating from
the left shoulder the arm beginning 3 days ago. Upper extremity
swelling.

EXAM:
MRI HEAD WITHOUT CONTRAST
MRI CERVICAL SPINE WITHOUT CONTRAST
TECHNIQUE: Multiplanar, multiecho pulse sequences of the brain and surrounding
structures, and cervical spine, to include the craniocervical
junction and cervicothoracic junction, were obtained without
intravenous contrast.

[Series 5: ax dwi_tracew · axial · 3.0mm · 0.65mm/px · z∈[-104,+51]mm · 3 of 48 slices shown]
[im 1/48]
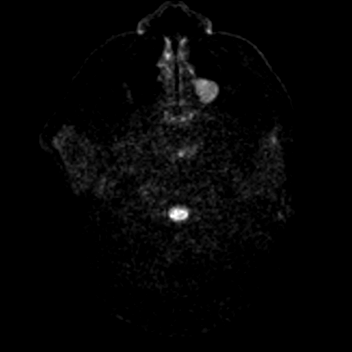
[im 24/48]
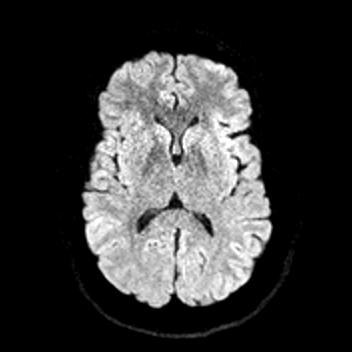
[im 48/48]
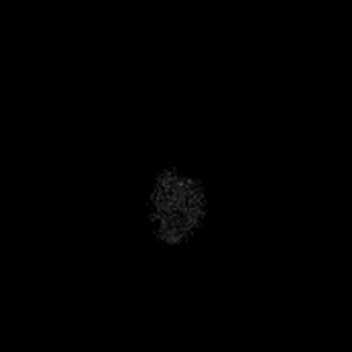

[Series 6: ax dwi_adc · axial · 3.0mm · 0.65mm/px · z∈[-104,+51]mm · 3 of 48 slices shown]
[im 1/48]
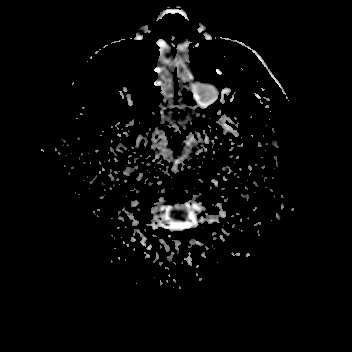
[im 24/48]
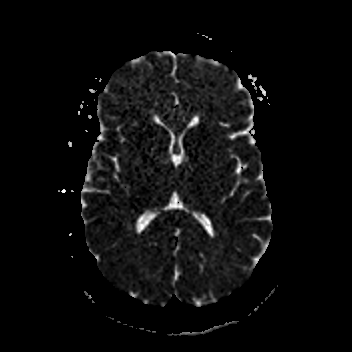
[im 48/48]
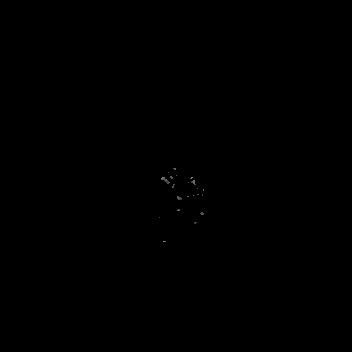

[Series 7: cor dwi_tracew · coronal · 5.0mm · 0.60mm/px · 3 of 36 slices shown]
[im 1/36]
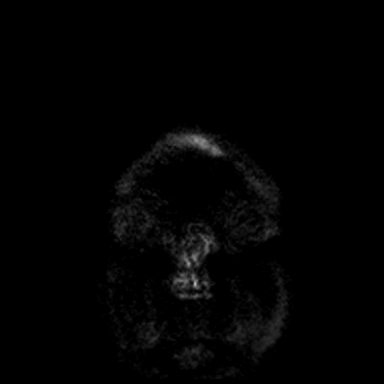
[im 18/36]
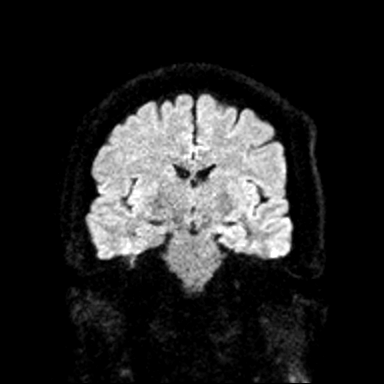
[im 36/36]
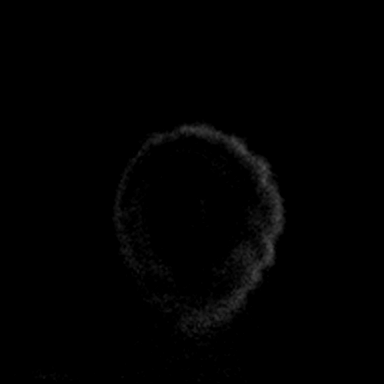

[Series 8: cor dwi_adc · coronal · 5.0mm · 0.60mm/px · 3 of 36 slices shown]
[im 1/36]
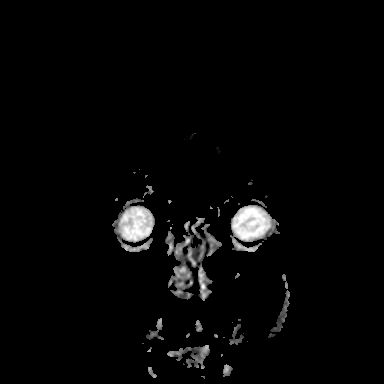
[im 18/36]
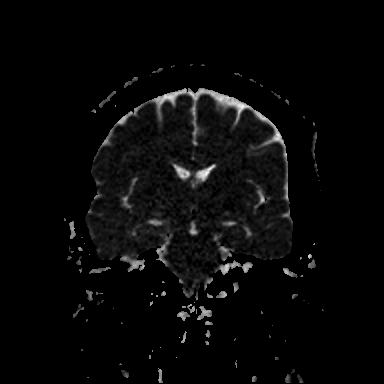
[im 36/36]
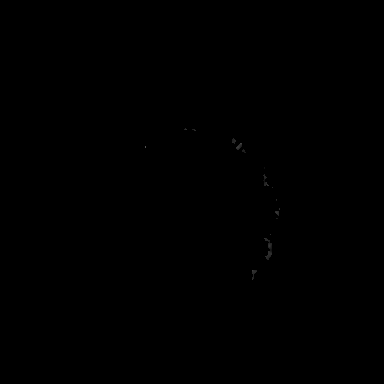

[Series 9: T1 · sagittal · 5.0mm · 0.62mm/px · 2 of 22 slices shown (1 of 2)]
[im 1/22]
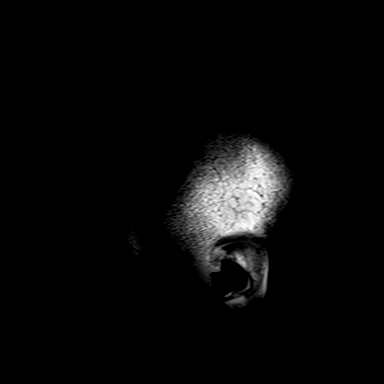
[im 22/22]
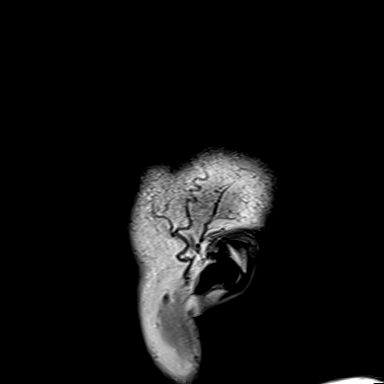

[Series 10: T2 · axial · 5.0mm · 0.53mm/px · z∈[-101,+49]mm · 2 of 26 slices shown (1 of 2)]
[im 1/26]
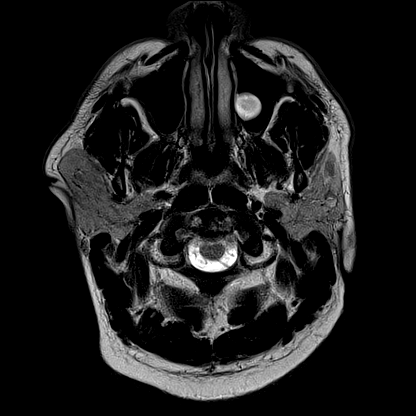
[im 26/26]
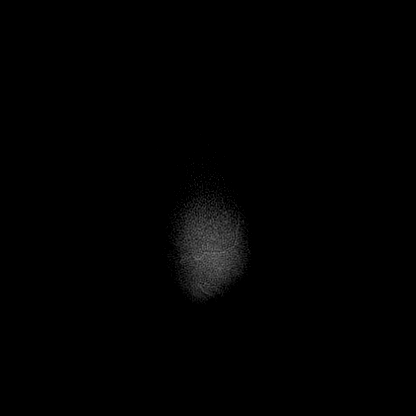

[Series 11: mag_images · axial · 3.0mm · 0.90mm/px · z∈[-115,+62]mm · 5 of 60 slices shown]
[im 1/60]
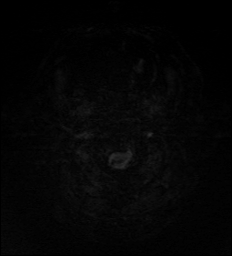
[im 15/60]
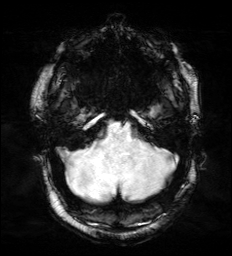
[im 30/60]
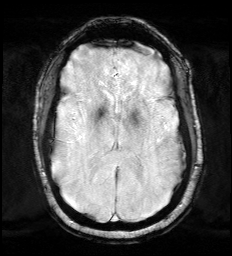
[im 45/60]
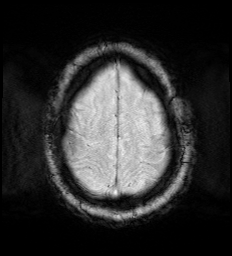
[im 60/60]
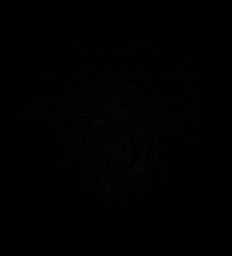

[Series 12: pha_images · axial · 3.0mm · 0.90mm/px · z∈[-115,+62]mm · 5 of 60 slices shown]
[im 1/60]
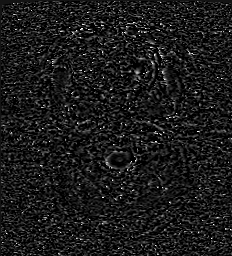
[im 15/60]
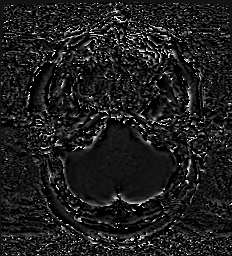
[im 30/60]
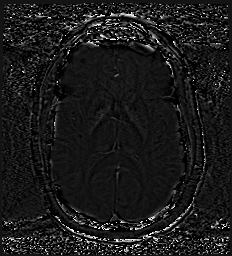
[im 45/60]
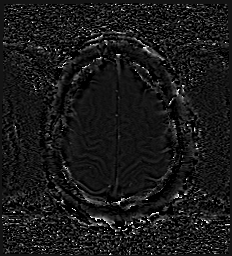
[im 60/60]
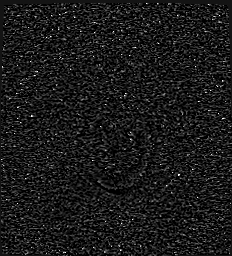

[Series 13: swi_images · axial · 3.0mm · 0.90mm/px · 1 of 60 slices shown]
[im 1/60]
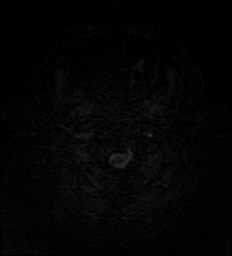

[Series 15: FLAIR · axial · 3.0mm · 0.53mm/px · z∈[-100,+47]mm · 3 of 42 slices shown]
[im 1/42]
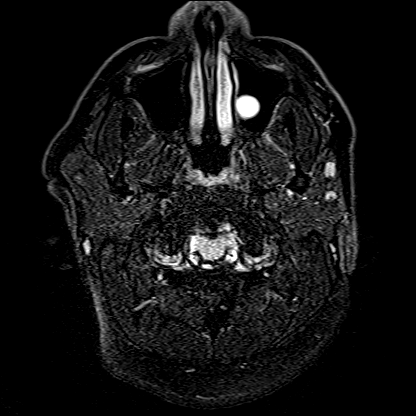
[im 21/42]
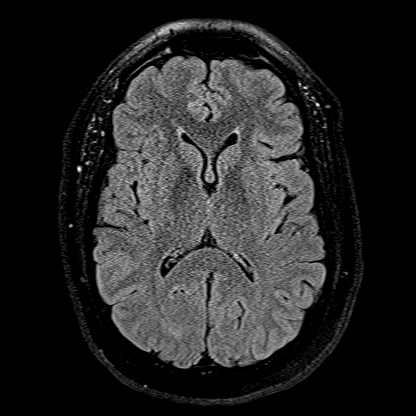
[im 42/42]
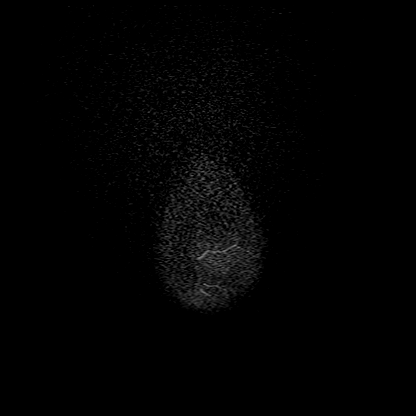

[Series 16: T1 · axial · 1.0mm · 0.98mm/px · z∈[-106,+53]mm · 8 of 160 slices shown (2 of 2)]
[im 1/160]
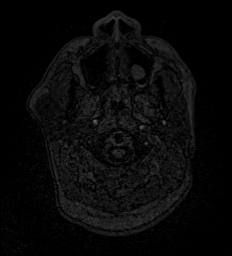
[im 29/160]
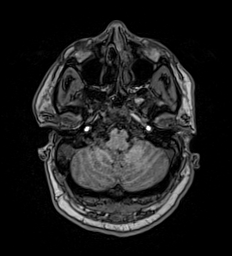
[im 44/160]
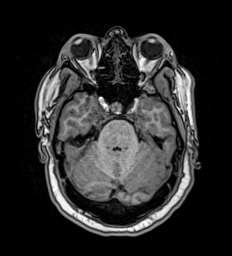
[im 73/160]
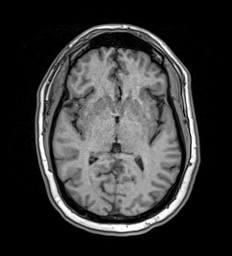
[im 87/160]
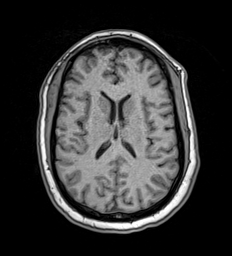
[im 116/160]
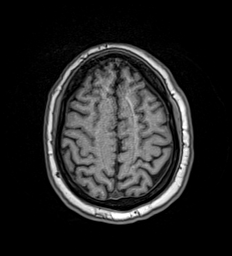
[im 131/160]
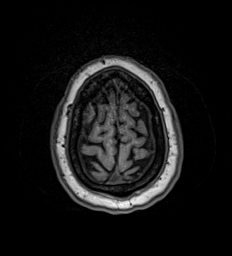
[im 160/160]
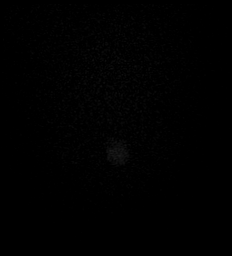

[Series 17: T2 · coronal · 5.0mm · 0.57mm/px · 2 of 28 slices shown (2 of 2)]
[im 1/28]
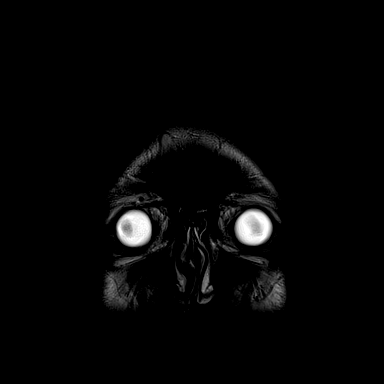
[im 28/28]
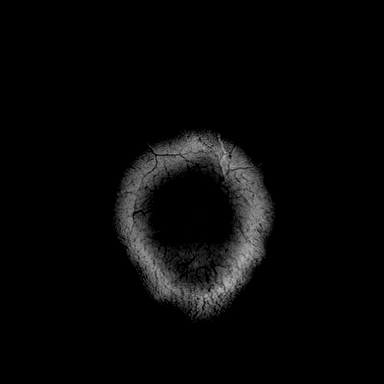

[40 of 48 positions shown; findings below may reference images not displayed]

FINDINGS: MRI HEAD FINDINGS

Brain: Normal cerebral volume. No restricted diffusion to suggest
acute infarction. No midline shift, mass effect, evidence of mass
lesion, ventriculomegaly, extra-axial collection or acute
intracranial hemorrhage.

Suprasellar and interpeduncular basilar cisterns are preserved. But
there is crowding of the cisterna magna with borderline to mild
cerebellar tonsillar ectopia (series 9, image 11 and series 10,
image 3). But no signal abnormality at the cervicomedullary
junction, brainstem and cerebellum otherwise appear normal.

Superimposed partially empty sella (series 17, image 18).

Gray and white matter signal is within normal limits throughout the
brain. No encephalomalacia or chronic cerebral blood products.

Vascular: Major intracranial vascular flow voids are preserved.

Skull and upper cervical spine: Visualized bone marrow signal is
within normal limits. Cervical spine detailed below.

Sinuses/Orbits: Mildly Disconjugate gaze, but otherwise negative
orbits. Left maxillary sinus mucous retention cyst measuring 15 mm.
Paranasal sinuses are otherwise well aerated.

Other: Opacified right middle ear and partial mastoid opacification
(series 10, image 7). Probable thickening of the right tympanic
membrane. But the visible deep internal auditory structures, and
superficial periauricular soft tissues are grossly normal.

MRI CERVICAL SPINE FINDINGS

Alignment: Mild reversal of cervical lordosis as seen on the earlier
CT. No spondylolisthesis.

Vertebrae: No marrow edema or evidence of acute osseous abnormality.
Nonspecific decreased T1 marrow signal in the cervical spine.

Cord: No spinal cord signal abnormality despite some degenerative
cord mass effect detailed below. Spinal cord volume appears normal.

Posterior Fossa, vertebral arteries, paraspinal tissues: Mild
cerebellar tonsillar ectopia and crowding of the cisterna magna
redemonstrated. No signal abnormality in the cervicomedullary
junction or cerebellar tonsils.

Preserved major vascular flow voids in the neck. Negative visible
neck soft tissues and right lung apex.

Disc levels:

C2-C3:  Negative.

C3-C4:  Negative.

C4-C5: Broad-based right paracentral disc bulging. Effaced ventral
CSF space but no spinal or foraminal stenosis.

C5-C6: Circumferential disc bulge and endplate spurring with
broad-based right paracentral component. Mild spinal stenosis and up
to mild right hemi cord mass effect. But only mild right C6
foraminal stenosis.

C6-C7: Broad-based left paracentral disc protrusion. Mild spinal
stenosis and ventral cord mass effect (series 8, image 24). But no
foraminal involvement or stenosis.

C7-T1:  Negative.

Negative visible upper thoracic levels.
IMPRESSION: 1. Crowding of the cisterna magna with mild cerebellar tonsillar
ectopia, and partially empty sella appearance of the pituitary.
Normal cervical spinal cord (despite degenerative changes, see #3).
Main differential considerations are Chiari 1 malformation and
idiopathic intracranial hypertension (pseudotumor cerebri).

2. Otherwise no acute intracranial abnormality, and negative
noncontrast MRI appearance of the brain.

3. Cervical spine disc and endplate degeneration maximal at C5-C6
and C6-C7. Up to mild degenerative spinal stenosis and spinal cord
mass effect at both levels, but no cord signal abnormality. No left
side cervical foraminal stenosis.

4. Opacified right middle ear with mild mastoid effusion. Consider
Otitis Media, less likely cholesteatoma.

## 2021-03-10 IMAGING — CR DG SHOULDER 2+V*L*
3 series · 3 of 3 positions shown · non-contrast
Comparison: None.

CLINICAL DATA: Left upper extremity pain.

EXAM:
LEFT SHOULDER - 2+ VIEW; LEFT ELBOW - COMPLETE 3+ VIEW

[shoulder grashey]
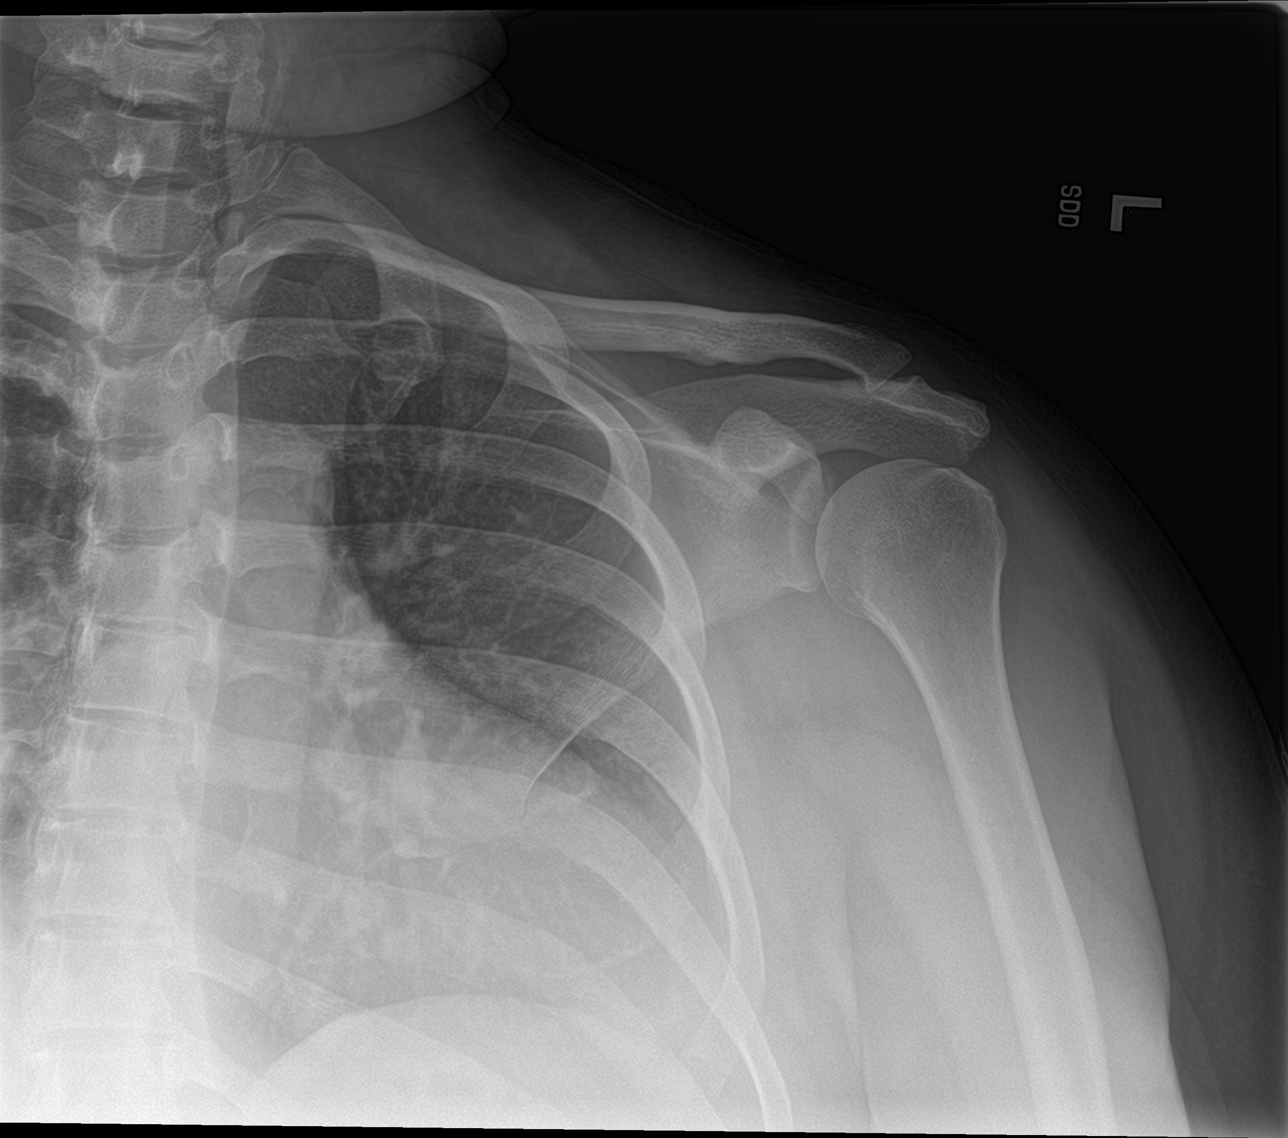

[shoulder ap neutral]
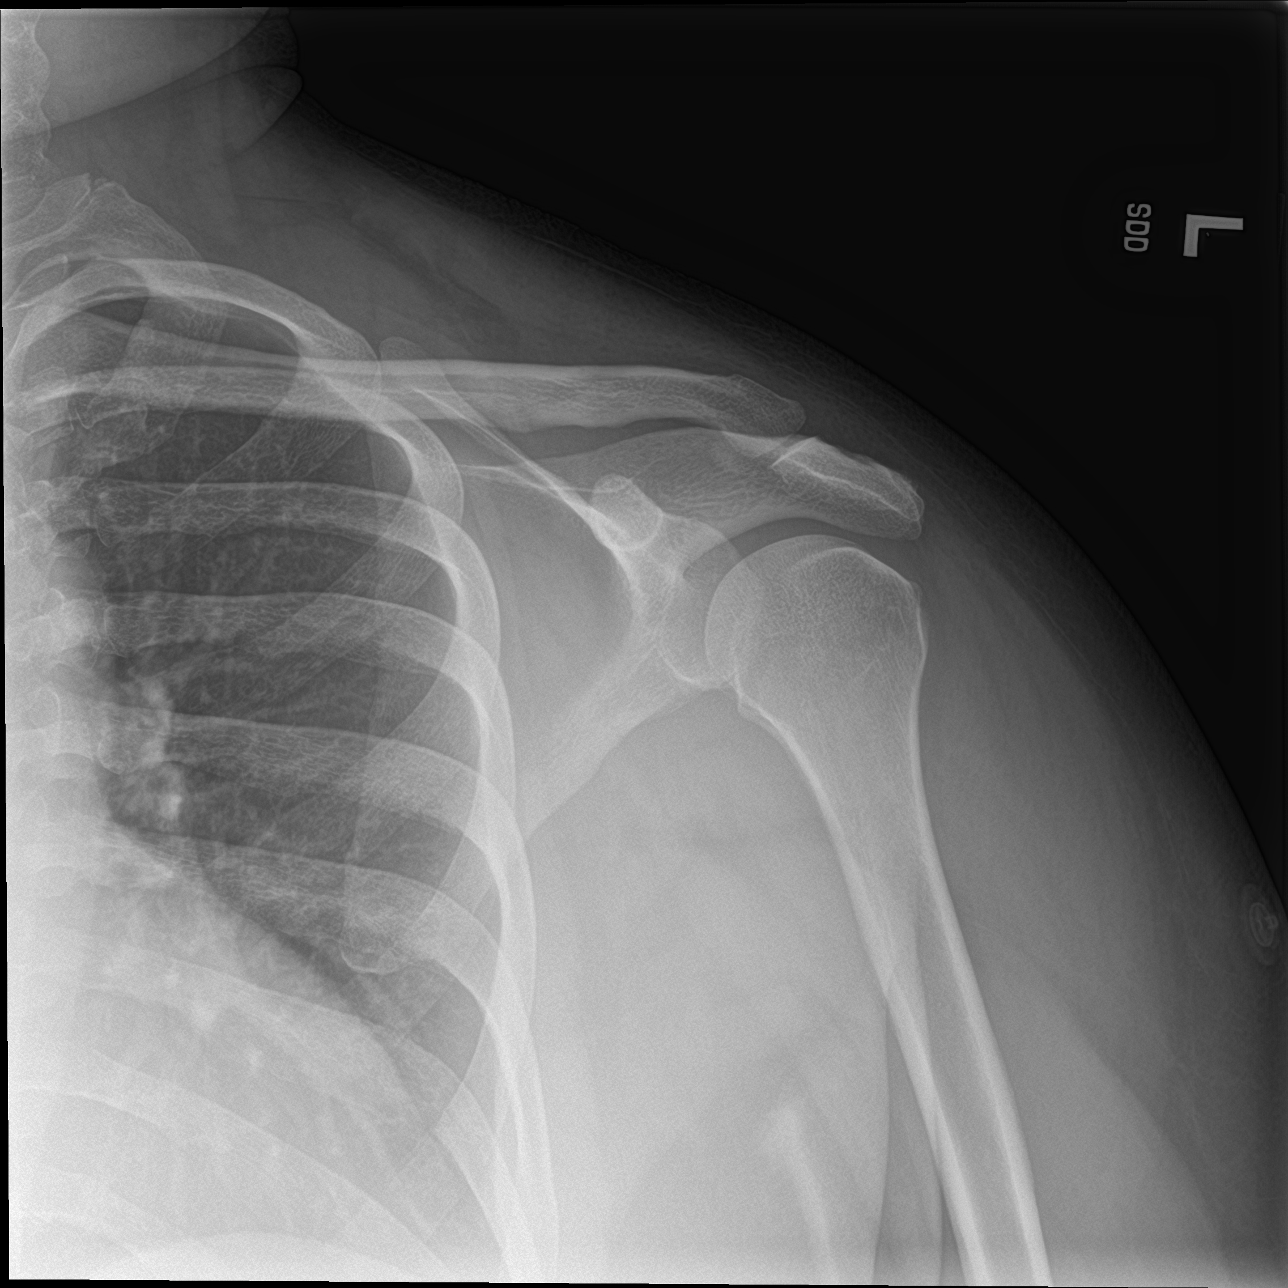

[shoulder y view]
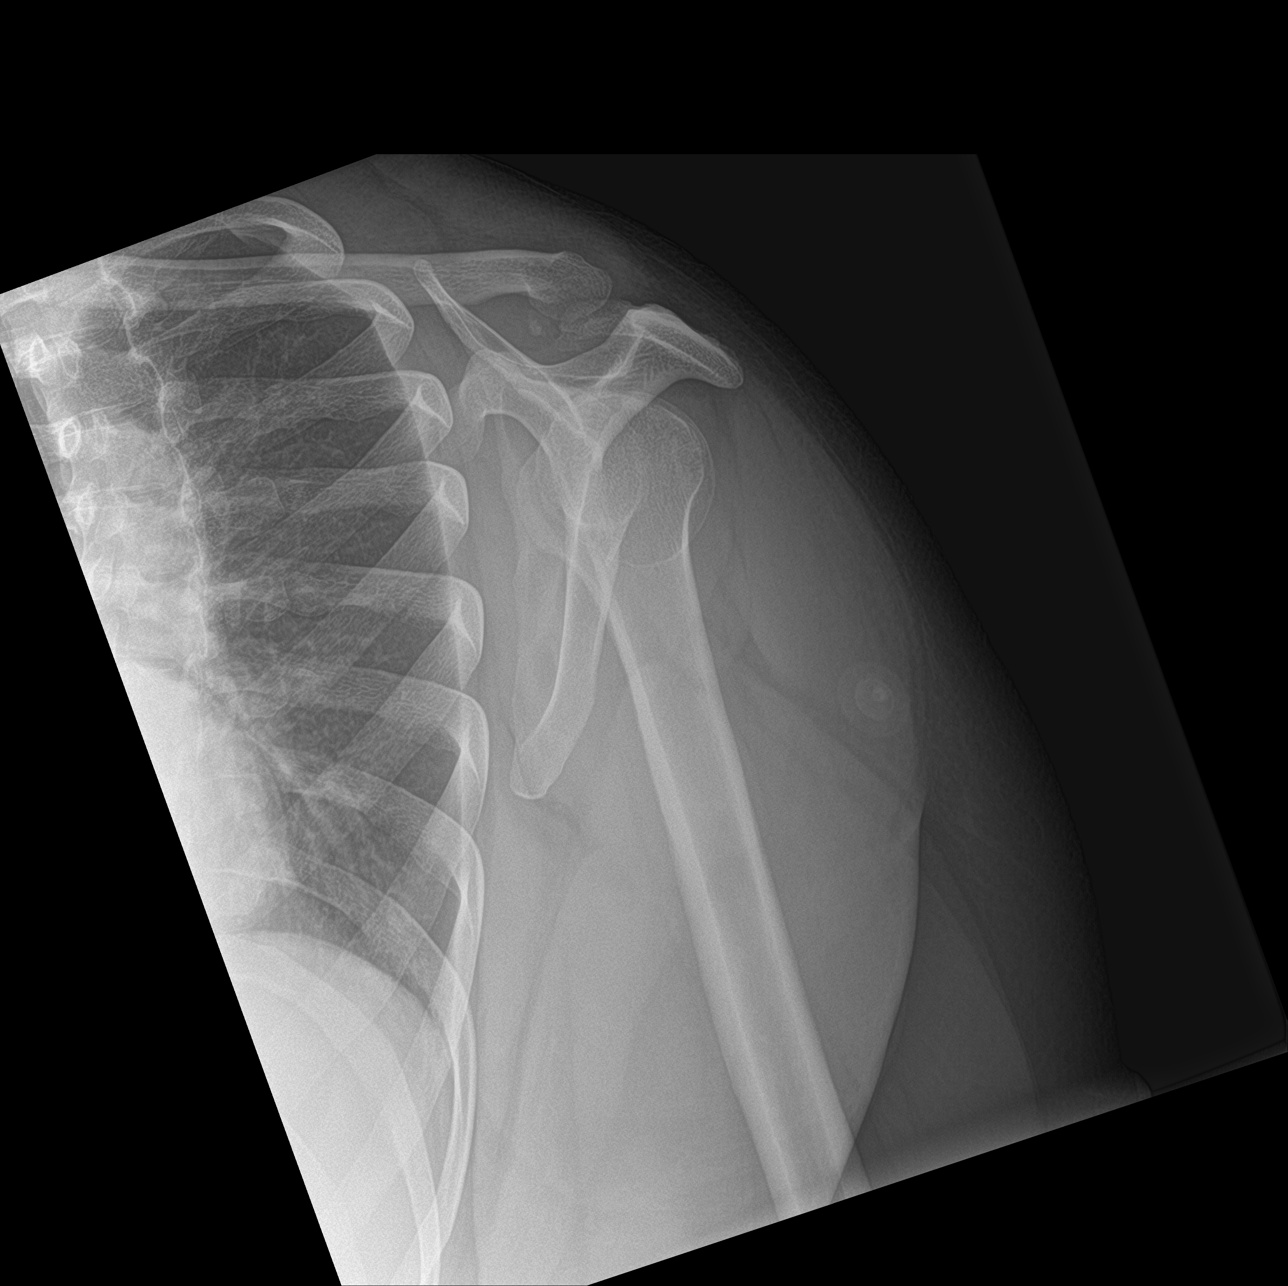

[3 of 3 positions shown; findings below may reference images not displayed]

FINDINGS: There is no evidence of fracture or dislocation. There is no
evidence of arthropathy or other focal bone abnormality. Soft
tissues are unremarkable.
IMPRESSION: Negative.

## 2021-03-10 IMAGING — CR DG CHEST 1V
1 series · 1 of 1 positions shown · non-contrast
Comparison: Chest radiograph dated [DATE].

CLINICAL DATA: Chest pain.

EXAM:
CHEST  1 VIEW

[chest pa]
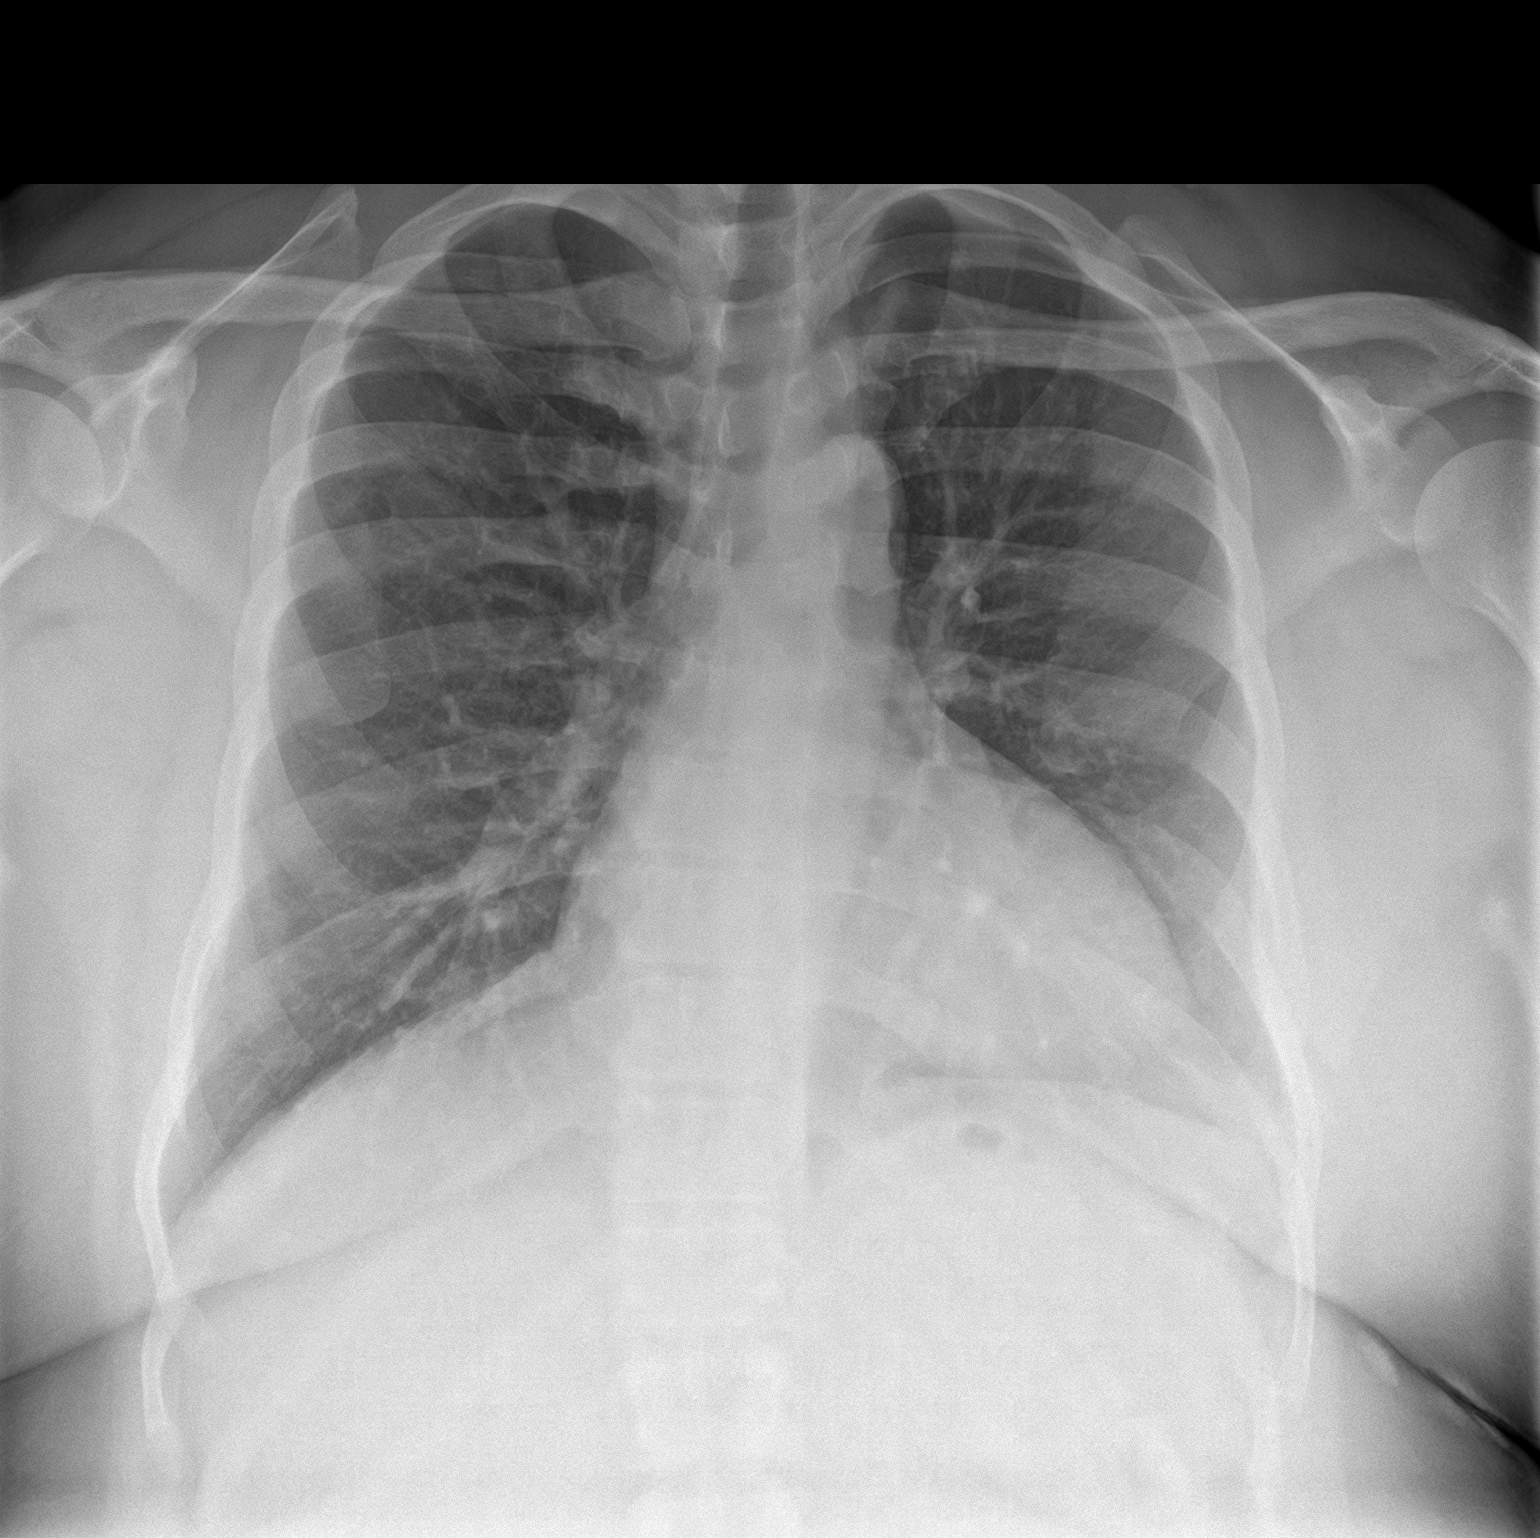

[1 of 1 positions shown; findings below may reference images not displayed]

FINDINGS: The heart size and mediastinal contours are within normal limits.
Both lungs are clear. The visualized skeletal structures are
unremarkable.
IMPRESSION: No active disease.

## 2021-03-10 IMAGING — MR MR CERVICAL SPINE W/O CM
5 series · 35 of 48 positions shown · non-contrast
Comparison: Cervical spine CT [5H] hours today.

CLINICAL DATA: 45-year-old male with headache. Pain radiating from
the left shoulder the arm beginning 3 days ago. Upper extremity
swelling.

EXAM:
MRI HEAD WITHOUT CONTRAST
MRI CERVICAL SPINE WITHOUT CONTRAST
TECHNIQUE: Multiplanar, multiecho pulse sequences of the brain and surrounding
structures, and cervical spine, to include the craniocervical
junction and cervicothoracic junction, were obtained without
intravenous contrast.

[Series 5: T2 · sagittal · 3.0mm · 0.62mm/px · 6 of 14 slices shown (1 of 2)]
[im 1/14]
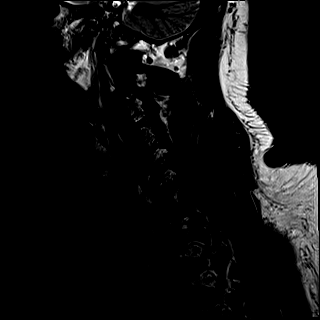
[im 3/14]
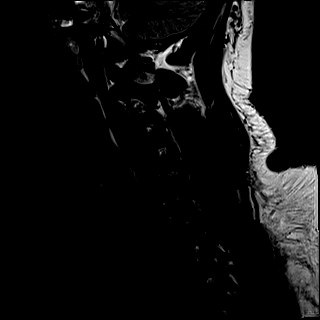
[im 6/14]
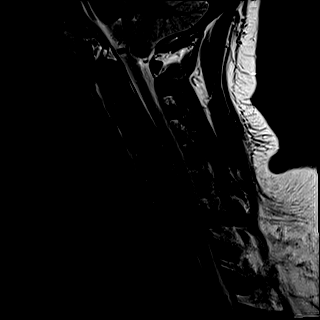
[im 8/14]
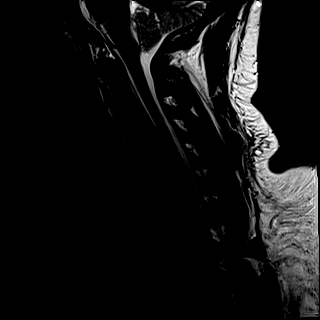
[im 11/14]
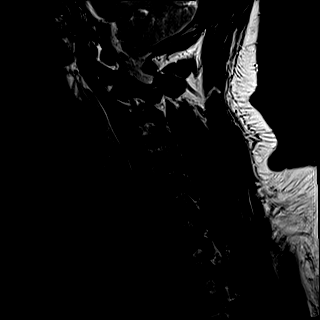
[im 14/14]
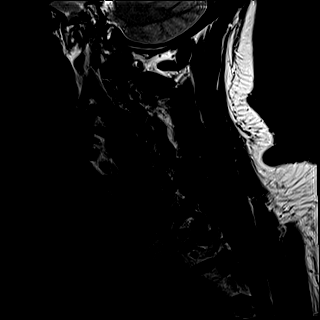

[Series 6: FLAIR · sagittal · 3.0mm · 0.78mm/px · 7 of 14 slices shown]
[im 1/14]
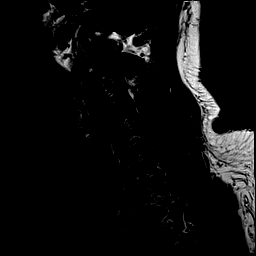
[im 3/14]
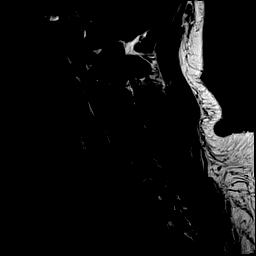
[im 5/14]
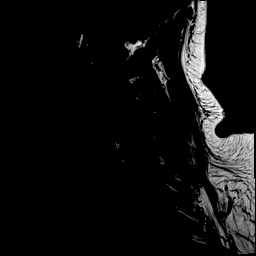
[im 7/14]
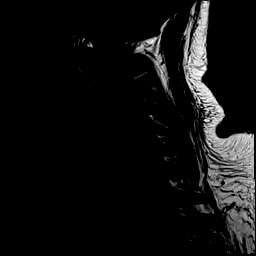
[im 9/14]
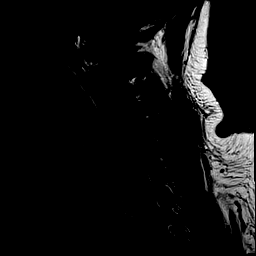
[im 11/14]
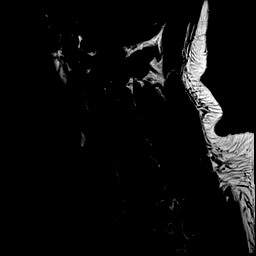
[im 14/14]
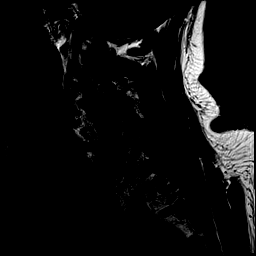

[Series 7: STIR · sagittal · 3.0mm · 0.62mm/px · 7 of 14 slices shown]
[im 1/14]
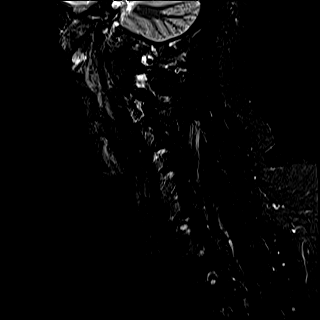
[im 3/14]
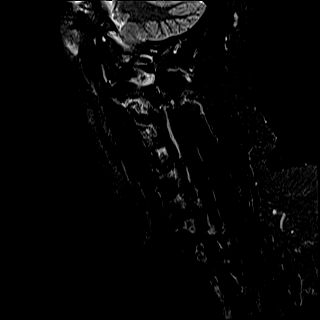
[im 5/14]
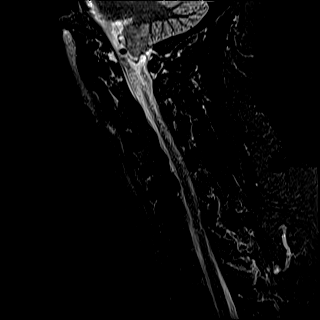
[im 7/14]
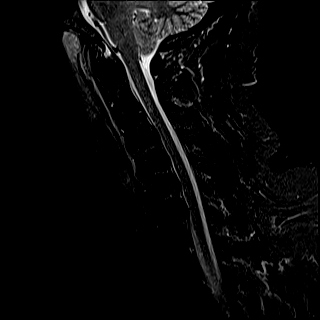
[im 9/14]
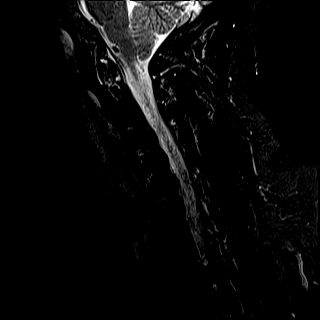
[im 11/14]
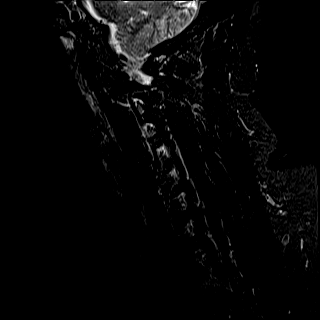
[im 14/14]
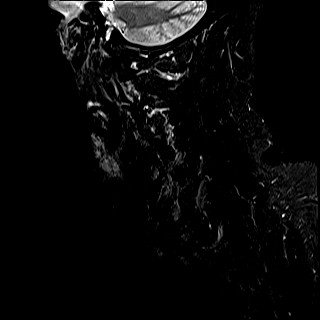

[Series 8: ax mpgr · axial · 3.0mm · 0.35mm/px · z∈[-242,-164]mm · 7 of 30 slices shown]
[im 1/30]
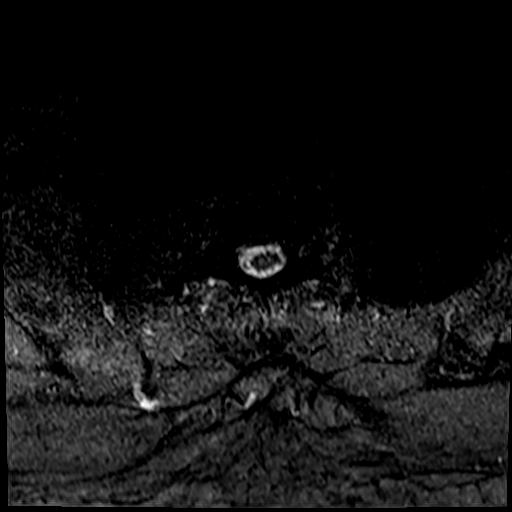
[im 5/30]
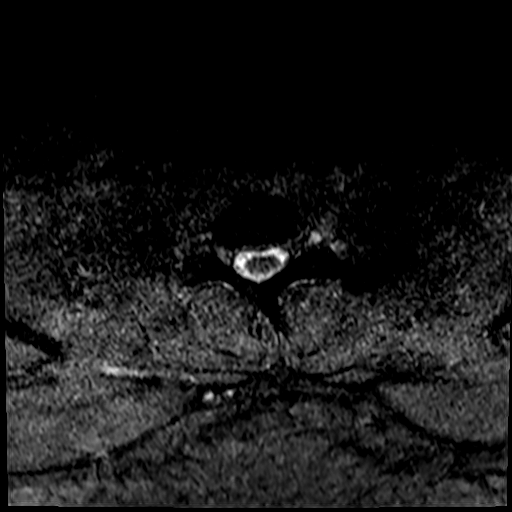
[im 9/30]
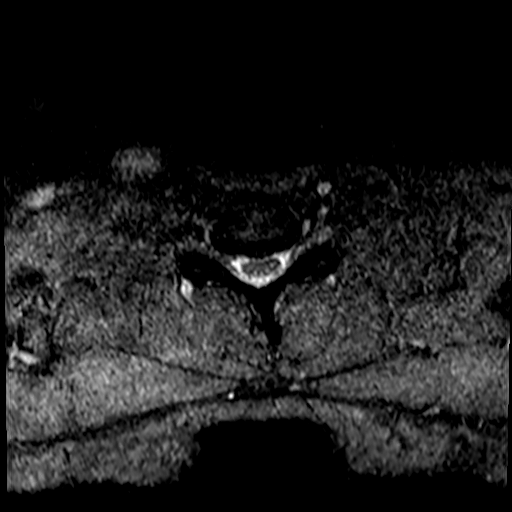
[im 14/30]
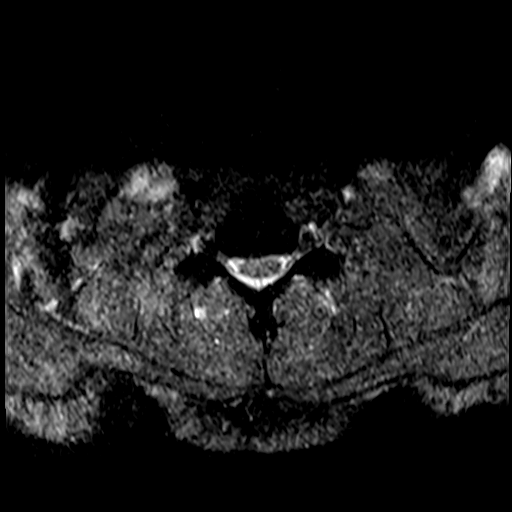
[im 16/30]
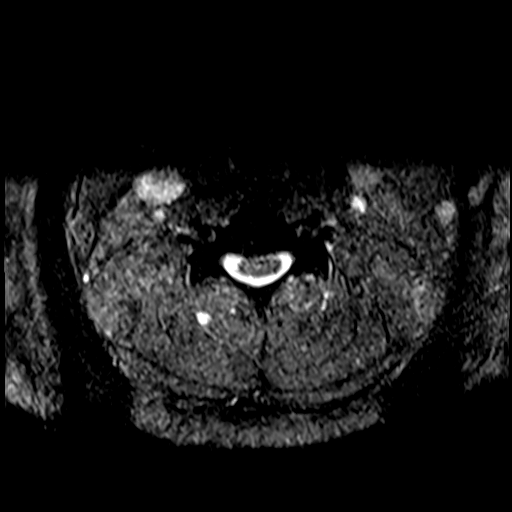
[im 21/30]
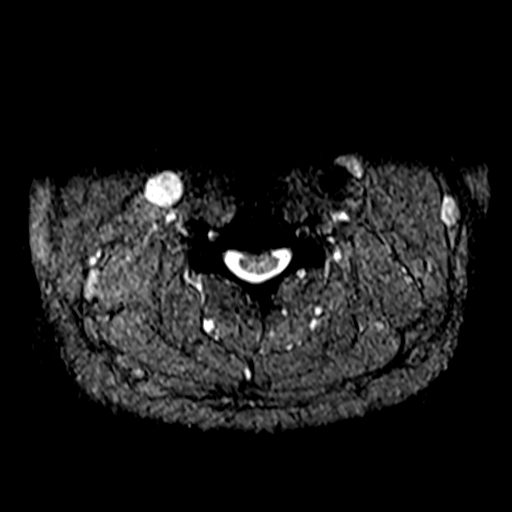
[im 25/30]
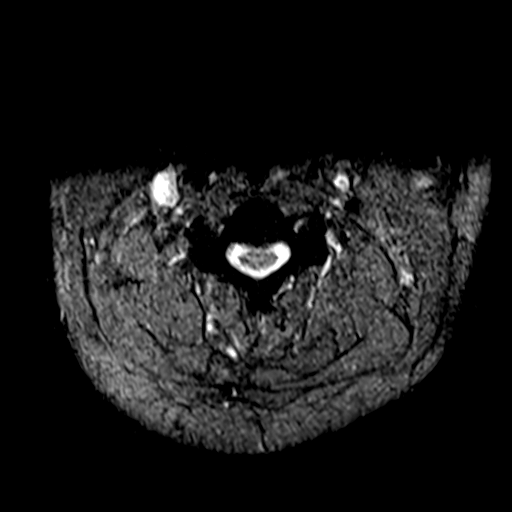

[Series 9: T2 · axial · 3.0mm · 0.70mm/px · z∈[-242,-147]mm · 8 of 30 slices shown (2 of 2)]
[im 1/30]
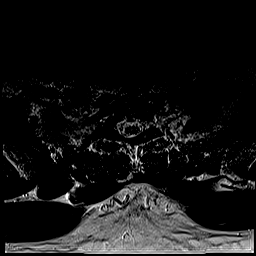
[im 5/30]
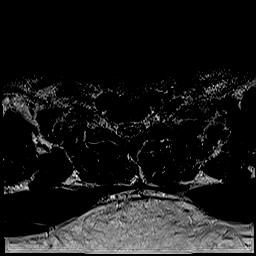
[im 9/30]
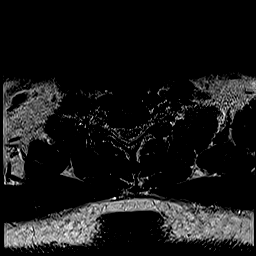
[im 14/30]
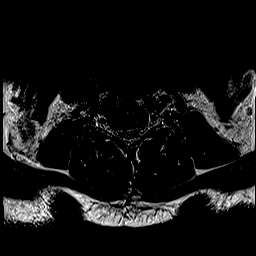
[im 16/30]
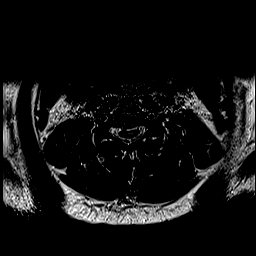
[im 21/30]
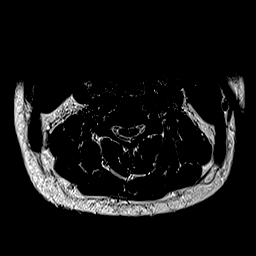
[im 25/30]
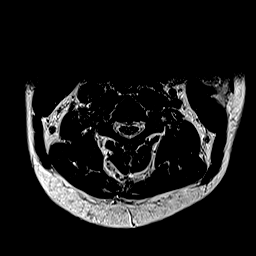
[im 30/30]
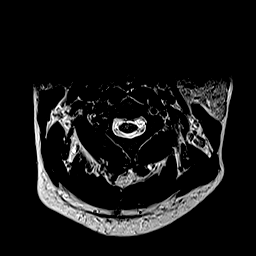

[35 of 48 positions shown; findings below may reference images not displayed]

FINDINGS: MRI HEAD FINDINGS

Brain: Normal cerebral volume. No restricted diffusion to suggest
acute infarction. No midline shift, mass effect, evidence of mass
lesion, ventriculomegaly, extra-axial collection or acute
intracranial hemorrhage.

Suprasellar and interpeduncular basilar cisterns are preserved. But
there is crowding of the cisterna magna with borderline to mild
cerebellar tonsillar ectopia (series 9, image 11 and series 10,
image 3). But no signal abnormality at the cervicomedullary
junction, brainstem and cerebellum otherwise appear normal.

Superimposed partially empty sella (series 17, image 18).

Gray and white matter signal is within normal limits throughout the
brain. No encephalomalacia or chronic cerebral blood products.

Vascular: Major intracranial vascular flow voids are preserved.

Skull and upper cervical spine: Visualized bone marrow signal is
within normal limits. Cervical spine detailed below.

Sinuses/Orbits: Mildly Disconjugate gaze, but otherwise negative
orbits. Left maxillary sinus mucous retention cyst measuring 15 mm.
Paranasal sinuses are otherwise well aerated.

Other: Opacified right middle ear and partial mastoid opacification
(series 10, image 7). Probable thickening of the right tympanic
membrane. But the visible deep internal auditory structures, and
superficial periauricular soft tissues are grossly normal.

MRI CERVICAL SPINE FINDINGS

Alignment: Mild reversal of cervical lordosis as seen on the earlier
CT. No spondylolisthesis.

Vertebrae: No marrow edema or evidence of acute osseous abnormality.
Nonspecific decreased T1 marrow signal in the cervical spine.

Cord: No spinal cord signal abnormality despite some degenerative
cord mass effect detailed below. Spinal cord volume appears normal.

Posterior Fossa, vertebral arteries, paraspinal tissues: Mild
cerebellar tonsillar ectopia and crowding of the cisterna magna
redemonstrated. No signal abnormality in the cervicomedullary
junction or cerebellar tonsils.

Preserved major vascular flow voids in the neck. Negative visible
neck soft tissues and right lung apex.

Disc levels:

C2-C3:  Negative.

C3-C4:  Negative.

C4-C5: Broad-based right paracentral disc bulging. Effaced ventral
CSF space but no spinal or foraminal stenosis.

C5-C6: Circumferential disc bulge and endplate spurring with
broad-based right paracentral component. Mild spinal stenosis and up
to mild right hemi cord mass effect. But only mild right C6
foraminal stenosis.

C6-C7: Broad-based left paracentral disc protrusion. Mild spinal
stenosis and ventral cord mass effect (series 8, image 24). But no
foraminal involvement or stenosis.

C7-T1:  Negative.

Negative visible upper thoracic levels.
IMPRESSION: 1. Crowding of the cisterna magna with mild cerebellar tonsillar
ectopia, and partially empty sella appearance of the pituitary.
Normal cervical spinal cord (despite degenerative changes, see #3).
Main differential considerations are Chiari 1 malformation and
idiopathic intracranial hypertension (pseudotumor cerebri).

2. Otherwise no acute intracranial abnormality, and negative
noncontrast MRI appearance of the brain.

3. Cervical spine disc and endplate degeneration maximal at C5-C6
and C6-C7. Up to mild degenerative spinal stenosis and spinal cord
mass effect at both levels, but no cord signal abnormality. No left
side cervical foraminal stenosis.

4. Opacified right middle ear with mild mastoid effusion. Consider
Otitis Media, less likely cholesteatoma.

## 2021-03-10 MED ORDER — METHYLPREDNISOLONE 4 MG PO TBPK: ORAL_TABLET | ORAL | 0 refills | Status: DC

## 2021-03-10 MED ORDER — ACETAMINOPHEN 500 MG PO TABS
1000.0000 mg | ORAL_TABLET | Freq: Once | ORAL | Status: AC
  Administered 2021-03-10: 1000 mg via ORAL
  Filled 2021-03-10: qty 2

## 2021-03-10 MED ORDER — IBUPROFEN 800 MG PO TABS
800.0000 mg | ORAL_TABLET | Freq: Once | ORAL | Status: AC
  Administered 2021-03-10: 800 mg via ORAL
  Filled 2021-03-10: qty 1

## 2021-03-10 MED ORDER — TRAMADOL HCL 50 MG PO TABS: 50.0000 mg | ORAL_TABLET | Freq: Four times a day (QID) | ORAL | 0 refills | Status: AC | PRN

## 2021-03-10 MED ORDER — ACETAMINOPHEN 500 MG PO TABS: 1000.0000 mg | ORAL_TABLET | Freq: Three times a day (TID) | ORAL | 0 refills | Status: AC | PRN

## 2021-03-10 MED ORDER — OXYCODONE HCL 5 MG PO TABS
5.0000 mg | ORAL_TABLET | Freq: Once | ORAL | Status: AC
  Administered 2021-03-10: 5 mg via ORAL
  Filled 2021-03-10: qty 1

## 2021-03-10 NOTE — Discharge Instructions (Signed)
As we discussed, your shoulder pain I think it is caused from a sprain.  For pain take 1000 mg of Tylenol 3 times a day and the steroids as prescribed.  If the pain is severe you may take 1 tramadol every 4-6 hours.  Apply heat or ice to the shoulder and do the range of motion exercises provided to you on this packet.  Also as we discussed, your brain imaging was abnormal and concerning for something called Chiari I malformation or intracranial hypertension.  It is very important they follow-up with neurology for further evaluation of this finding.  We have sent a referral but please make sure to reach out to her primary care doctor first thing this morning so they can help facilitate follow-up.  Return to the emergency room for new or worsening shoulder pain, swelling, redness, or fever

## 2021-03-10 NOTE — ED Provider Notes (Signed)
[facility] Provider Note    Event Date/Time   First MD Initiated Contact with Patient 03/10/21 0139     (approximate)   History   No chief complaint on file.   HPI  Mark [name] is a 46 y.o. male with history of asthma and obesity who presents for evaluation of left shoulder pain.  Patient symptoms started 3 days ago.  He reports that he woke up with pain on the left shoulder.  Over the last 3 days the pain has become progressively worse and then today severe.  Patient reports that he has a very low tolerance for pain.  Has been taking some ibuprofen, BC powder, ice and hot compresses with no significant improvement.  He reports the pain is markedly worse with movement of his shoulder.  He initially felt that he had slept onto the shoulder in a funny position.  He denies any trauma.  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  The pain is constant.  No chest pain or shortness of breath, no paresthesias of his hand.  He reports that the pain starts at the base of the neck on the left and radiates down into the shoulder.  No history of IV drug use or recent blood work done.      Past Medical History:  Diagnosis Date   Asthma     Past Surgical History:  Procedure Laterality Date   HERNIA REPAIR       Physical Exam   Triage Vital Signs: ED Triage Vitals  Enc Vitals Group     BP 03/10/21 0026 (!) 149/105     Pulse Rate 03/10/21 0026 78     Resp 03/10/21 0026 20     Temp 03/10/21 0026 98.5 F (36.9 C)     Temp Source 03/10/21 0026 Oral     SpO2 03/10/21 0026 98 %     Weight 03/10/21 0022 280 lb (127 kg)     Height 03/10/21 0022 5\' 9"  (1.753 m)     Head Circumference --      Peak Flow --      Pain Score 03/10/21 0022 10     Pain Loc --      Pain Edu? --      Excl. in GC? --     Most recent vital signs: Vitals:   03/10/21 0259 03/10/21  0428  BP: 118/74 124/76  Pulse: 79 85  Resp: 17 19  Temp:    SpO2: 99% 100%     Constitutional: Alert and oriented. Well appearing and in no apparent distress. HEENT:      Head: Normocephalic and atraumatic.         Eyes: Conjunctivae are normal. Sclera is non-icteric.       Mouth/Throat: Mucous membranes are moist.       Neck: Supple with no signs of meningismus. Cardiovascular: Regular rate and rhythm. No murmurs, gallops, or rubs. 2+ symmetrical distal pulses are present in all extremities.  Respiratory: Normal respiratory effort. Lungs are clear to auscultation bilaterally.  Gastrointestinal: Soft, non tender. Musculoskeletal:  No swelling, warmth or erythema of the joint.  Patient is tender to palpation anteriorly with no obvious deformities.  Strong distal pulses.  Strength and sensation of the hand and forearm is intact.  There is no midline cervical spine tenderness. Neurologic: Normal speech and language. Face  is symmetric. Moving all extremities. No gross focal neurologic deficits are appreciated. Skin: Skin is warm, dry and intact. No rash noted. Psychiatric: Mood and affect are normal. Speech and behavior are normal.  ED Results / Procedures / Treatments   Labs (all labs ordered are listed, but only abnormal results are displayed) Labs Reviewed  BASIC METABOLIC PANEL - Abnormal; Notable for the following components:      Result Value   Glucose, Bld 184 (*)    All other components within normal limits  CBC - Abnormal; Notable for the following components:   WBC 12.7 (*)    Hemoglobin 11.6 (*)    HCT 37.6 (*)    All other components within normal limits  TROPONIN I (HIGH SENSITIVITY)  TROPONIN I (HIGH SENSITIVITY)     EKG  ED ECG REPORT I, Nita Sickle, the attending physician, personally viewed and interpreted this ECG.  Normal sinus rhythm, normal intervals, no ST elevations or depressions   RADIOLOGY I, Nita Sickle, attending MD, have  personally viewed and interpreted the images obtained during this visit as below:  X-ray of the shoulder and elbow with no acute findings.  CT of the cervical spine showing some arthritic changes, formal read is pending.   ___________________________________________________ Interpretation by Radiologist:  DG Chest 1 View  Result Date: 03/10/2021 CLINICAL DATA:  Chest pain. EXAM: CHEST  1 VIEW COMPARISON:  Chest radiograph dated 04/05/2016. FINDINGS: The heart size and mediastinal contours are within normal limits. Both lungs are clear. The visualized skeletal structures are unremarkable. IMPRESSION: No active disease. Electronically Signed   By: Elgie Collard M.D.   On: 03/10/2021 01:06   DG Elbow Complete Left  Result Date: 03/10/2021 CLINICAL DATA:  Left upper extremity pain. EXAM: LEFT SHOULDER - 2+ VIEW; LEFT ELBOW - COMPLETE 3+ VIEW COMPARISON:  None. FINDINGS: There is no evidence of fracture or dislocation. There is no evidence of arthropathy or other focal bone abnormality. Soft tissues are unremarkable. IMPRESSION: Negative. Electronically Signed   By: Elgie Collard M.D.   On: 03/10/2021 01:02   CT Cervical Spine Wo Contrast  Result Date: 03/10/2021 CLINICAL DATA:  Neck pain, left shoulder pain EXAM: CT CERVICAL SPINE WITHOUT CONTRAST TECHNIQUE: Multidetector CT imaging of the cervical spine was performed without intravenous contrast. Multiplanar CT image reconstructions were also generated. RADIATION DOSE REDUCTION: This exam was performed according to the departmental dose-optimization program which includes automated exposure control, adjustment of the mA and/or kV according to patient size and/or use of iterative reconstruction technique. COMPARISON:  None. FINDINGS: Alignment: There is mild cervical kyphosis.  No listhesis. Skull base and vertebrae: Craniocervical alignment is normal. The atlantodental interval is not widened. There is no acute fracture of the cervical spine.  Vertebral body height is preserved. Soft tissues and spinal canal: The cerebellar tonsils are low lying resulting in effacement of the extra-axial space at the foramen magnum. 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 No canal hematoma. No prevertebral soft tissue swelling. No paraspinal fluid collections. Disc levels: There is mild intervertebral disc space narrowing and endplate remodeling at C5-6 in keeping with changes of mild to moderate degenerative disc disease. Milder degenerative changes are noted at C4-5 and C6-7. The prevertebral soft tissues are not thickened. Review of the axial images demonstrates no significant neuroforaminal narrowing. Upper chest: Negative. Other: None. IMPRESSION: No acute fracture or listhesis. Low lying cerebellar  tonsils with face mint of the extra-axial space at the foramen magnum. This would be better assessed with MRI examination. 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 Mild to moderate degenerative disc disease C4-C7. Electronically Signed   By: Helyn Numbers M.D.   On: 03/10/2021 02:23   MR BRAIN WO CONTRAST  Result Date: 03/10/2021 CLINICAL DATA:  46 year old male with headache. Pain radiating from the left shoulder the arm beginning 3 days ago. Upper extremity swelling. EXAM: MRI HEAD WITHOUT CONTRAST MRI CERVICAL SPINE WITHOUT CONTRAST TECHNIQUE: Multiplanar, multiecho pulse sequences of the brain and surrounding structures, and cervical spine, to include the craniocervical junction and cervicothoracic junction, were obtained without intravenous contrast. COMPARISON:  Cervical spine CT 0208 hours today. FINDINGS: MRI HEAD FINDINGS Brain: Normal cerebral volume. No restricted diffusion to suggest acute infarction. No midline shift, mass effect, evidence of mass lesion, ventriculomegaly,  extra-axial collection or acute intracranial hemorrhage. Suprasellar and interpeduncular basilar cisterns are preserved. But there is crowding of the cisterna magna with borderline to mild cerebellar tonsillar ectopia (series 9, image 11 and series 10, image 3). But no signal abnormality at the cervicomedullary junction, brainstem and cerebellum otherwise appear normal. 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 Wallace Cullens and white matter signal is within normal limits throughout the brain. No encephalomalacia or chronic cerebral blood products. Vascular: Major intracranial vascular flow voids are preserved. Skull and upper cervical spine: Visualized bone marrow signal is within normal limits. Cervical spine detailed below. Sinuses/Orbits: Mildly Disconjugate gaze, but otherwise negative orbits. Left maxillary sinus mucous retention cyst measuring 15 mm. Paranasal sinuses are otherwise well aerated. Other: Opacified right middle ear and partial mastoid opacification (series 10, image 7). Probable thickening of the right tympanic membrane. But the visible deep internal auditory structures, and superficial periauricular soft tissues are grossly normal. MRI CERVICAL SPINE FINDINGS Alignment: Mild reversal of cervical lordosis as seen on the earlier CT. No spondylolisthesis. Vertebrae: No marrow edema or evidence of acute osseous abnormality. Nonspecific decreased T1 marrow signal in the cervical spine. Cord: No spinal cord signal abnormality despite some degenerative cord mass effect detailed below. Spinal cord volume appears normal. Posterior Fossa, vertebral arteries, paraspinal tissues: Mild cerebellar tonsillar ectopia and crowding of the cisterna magna redemonstrated. No signal abnormality in the cervicomedullary junction or cerebellar tonsils. Preserved major vascular flow voids in the neck. Negative visible neck soft tissues and right lung apex. Disc levels: C2-C3:  Negative. C3-C4:  Negative. C4-C5:  Broad-based right paracentral disc bulging. Effaced ventral CSF space but no spinal or foraminal stenosis. C5-C6: Circumferential disc bulge and endplate spurring with broad-based right paracentral component. Mild spinal stenosis and up to mild right hemi cord mass effect. But only mild right C6 foraminal stenosis. C6-C7: Broad-based left paracentral disc protrusion. Mild spinal stenosis and ventral cord mass effect (series 8, image 24). But no foraminal involvement or stenosis. C7-T1:  Negative. Negative visible upper thoracic levels. IMPRESSION: 1. 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 Normal cervical spinal cord (despite degenerative changes, see #3). Main differential considerations are Chiari 1 malformation and idiopathic intracranial hypertension (pseudotumor cerebri). 2. Otherwise no acute intracranial abnormality, and negative noncontrast MRI appearance of the brain. 3. Cervical spine disc and endplate degeneration maximal at C5-C6 and C6-C7. Up to mild degenerative spinal stenosis and spinal cord mass effect at both levels, but no cord signal abnormality. No left side cervical  foraminal stenosis. 4. Opacified right middle ear with mild mastoid effusion. Consider Otitis Media, less likely cholesteatoma. Electronically Signed   By: Odessa FlemingH  Hall M.D.   On: 03/10/2021 04:34   MR Cervical Spine Wo Contrast  Result Date: 03/10/2021 CLINICAL DATA:  46 year old male with headache. Pain radiating from the left shoulder the arm beginning 3 days ago. Upper extremity swelling. EXAM: MRI HEAD WITHOUT CONTRAST MRI CERVICAL SPINE WITHOUT CONTRAST TECHNIQUE: Multiplanar, multiecho pulse sequences of the brain and surrounding structures, and cervical spine, to include the craniocervical junction and cervicothoracic junction, were obtained without intravenous contrast. COMPARISON:  Cervical spine CT 0208 hours today. FINDINGS: MRI HEAD  FINDINGS Brain: Normal cerebral volume. No restricted diffusion to suggest acute infarction. No midline shift, mass effect, evidence of mass lesion, ventriculomegaly, extra-axial collection or acute intracranial hemorrhage. Suprasellar and interpeduncular basilar cisterns are preserved. But there is crowding of the cisterna magna with borderline to mild cerebellar tonsillar ectopia (series 9, image 11 and series 10, image 3). But no signal abnormality at the cervicomedullary junction, brainstem and cerebellum otherwise appear normal. 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 Wallace CullensGray and white matter signal is within normal limits throughout the brain. No encephalomalacia or chronic cerebral blood products. Vascular: Major intracranial vascular flow voids are preserved. Skull and upper cervical spine: Visualized bone marrow signal is within normal limits. Cervical spine detailed below. Sinuses/Orbits: Mildly Disconjugate gaze, but otherwise negative orbits. Left maxillary sinus mucous retention cyst measuring 15 mm. Paranasal sinuses are otherwise well aerated. Other: Opacified right middle ear and partial mastoid opacification (series 10, image 7). Probable thickening of the right tympanic membrane. But the visible deep internal auditory structures, and superficial periauricular soft tissues are grossly normal. MRI CERVICAL SPINE FINDINGS Alignment: Mild reversal of cervical lordosis as seen on the earlier CT. No spondylolisthesis. Vertebrae: No marrow edema or evidence of acute osseous abnormality. Nonspecific decreased T1 marrow signal in the cervical spine. Cord: No spinal cord signal abnormality despite some degenerative cord mass effect detailed below. Spinal cord volume appears normal. Posterior Fossa, vertebral arteries, paraspinal tissues: Mild cerebellar tonsillar ectopia and crowding of the cisterna magna redemonstrated. No signal abnormality in the cervicomedullary junction or cerebellar  tonsils. Preserved major vascular flow voids in the neck. Negative visible neck soft tissues and right lung apex. Disc levels: C2-C3:  Negative. C3-C4:  Negative. C4-C5: Broad-based right paracentral disc bulging. Effaced ventral CSF space but no spinal or foraminal stenosis. C5-C6: Circumferential disc bulge and endplate spurring with broad-based right paracentral component. Mild spinal stenosis and up to mild right hemi cord mass effect. But only mild right C6 foraminal stenosis. C6-C7: Broad-based left paracentral disc protrusion. Mild spinal stenosis and ventral cord mass effect (series 8, image 24). But no foraminal involvement or stenosis. C7-T1:  Negative. Negative visible upper thoracic levels. IMPRESSION: 1. 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 Normal cervical spinal cord (despite degenerative changes, see #3). Main differential considerations are Chiari 1 malformation and idiopathic intracranial hypertension (pseudotumor cerebri). 2. Otherwise no acute intracranial abnormality, and negative noncontrast MRI appearance of the brain. 3. Cervical spine disc and endplate degeneration maximal at C5-C6 and C6-C7. Up to mild degenerative spinal stenosis and spinal cord mass effect at both levels, but no cord signal abnormality. No left side cervical foraminal stenosis. 4. Opacified right middle ear with mild mastoid effusion. Consider Otitis Media, less likely cholesteatoma. Electronically Signed   By: Odessa FlemingH  Hall  M.D.   On: 03/10/2021 04:34   DG Shoulder Left  Result Date: 03/10/2021 CLINICAL DATA:  Left upper extremity pain. EXAM: LEFT SHOULDER - 2+ VIEW; LEFT ELBOW - COMPLETE 3+ VIEW COMPARISON:  None. FINDINGS: There is no evidence of fracture or dislocation. There is no evidence of arthropathy or other focal bone abnormality. Soft tissues are unremarkable. IMPRESSION: Negative. Electronically Signed   By: Elgie CollardArash  Radparvar  M.D.   On: 03/10/2021 01:02      PROCEDURES:  Critical Care performed: No  Procedures    IMPRESSION / MDM / ASSESSMENT AND PLAN / ED COURSE  I reviewed the triage vital signs and the nursing notes.  46 y.o. male with history of asthma and obesity who presents for evaluation of atraumatic left shoulder pain.  Patient is well-appearing in no distress.  There is no obvious deformity, warmth, erythema or swelling of the shoulder joint.  There is no midline tenderness to palpation of the cervical spine.  Neurovascular exam of the left upper extremity is completely normal  Ddx: Shoulder sprain versus radicular pain.  No signs of ischemic limb, cellulitis, septic joint   Plan: X-ray of the shoulder and elbow if those are negative we will do CT of the cervical spine.  EKG, chemistry panel, CBC and troponin.  Will give 1000 mg of Tylenol, 800 mg of ibuprofen, and 5 mg of oxycodone for pain   MEDICATIONS GIVEN IN ED: Medications  ibuprofen (ADVIL) tablet 800 mg (800 mg Oral Given 03/10/21 0156)  acetaminophen (TYLENOL) tablet 1,000 mg (1,000 mg Oral Given 03/10/21 0156)  oxyCODONE (Oxy IR/ROXICODONE) immediate release tablet 5 mg (5 mg Oral Given 03/10/21 0156)     ED COURSE: X-rays negative for any acute pathology.  CT of the cervical spine concerning for possible low-lying cerebellar tonsils and arthritic changes.  Radiologist recommended an MRI.  MRI of the brain and the cervical spine has been ordered.  EKG and troponin with no signs of cardiac ischemia.  Patient with mild leukocytosis of unknown significance.  Stable mild anemia.  Mild hyperglycemia no evidence of DKA.  Plan to reassess after MRI is done.  _________________________ 5:01 AM on 03/10/2021 ----------------------------------------- Pain markedly improved.  MRI concerning for possible Chiari I malformation versus idiopathic intracranial hypertension.  Patient has no changes in vision, no frequent headaches or any other  concerning findings on exam.  Admission was considered but with no concerning findings on exam I feel that is safe for patient follow-up with neurology as an outpatient.  Referral has been placed.  Recommended contacting his PCP this morning to help get this follow-up scheduled soon as possible.  The MRI of the cervical spine did not show any signs of nerve compression or severe spinal cord stenosis that could be causing the patient's pain.  Discussed range of motion exercises for the shoulder.  We will start patient on a Medrol pack and also recommended 1000 mg of Tylenol 3 times daily.  Patient provided with a short course of tramadol as needed for breakthrough pain.  Discussed my standard return precautions with him.  All the findings above were discussed with patient in detail and ALL questions were answered.   Consults: none   EMR reviewed including patient's last visit with his primary care doctor from November 2022 where he was seen for abdominal pain, diabetes and hypertension       FINAL CLINICAL IMPRESSION(S) / ED DIAGNOSES   Final diagnoses:  Sprain of left shoulder, unspecified shoulder sprain type,  initial encounter  Abnormal brain MRI     Rx / DC Orders   ED Discharge Orders          Ordered    methylPREDNISolone (MEDROL DOSEPAK) 4 MG TBPK tablet        03/10/21 0457    traMADol (ULTRAM) 50 MG tablet  Every 6 hours PRN        03/10/21 0457    acetaminophen (TYLENOL) 500 MG tablet  Every 8 hours PRN        03/10/21 0457    Ambulatory referral to Neurology       Comments: Abnormal brain MRI concerning for Chiari 1 vs IIH   03/10/21 0500             Note:  This document was prepared using Dragon voice recognition software and may include unintentional dictation errors.   Don Perking, [place], MD 03/10/21 [phone]

## 2021-03-10 NOTE — ED Triage Notes (Addendum)
Pt c/o left shoulder pain that started 3 days ago and has spread down to left lower arm.  Pt not able to move d/t pain.  Pt ambulatory to triage, NAD noted.  Pt denies numbness/tingling, states fingers "feel heavy" and arm is swollen.

## 2022-05-07 ENCOUNTER — Encounter: Payer: Self-pay | Admitting: *Deleted

## 2022-05-08 ENCOUNTER — Ambulatory Visit
Admission: RE | Admit: 2022-05-08 | Discharge: 2022-05-08 | Disposition: A | Discharge status: Home / Self Care | Payer: BC Managed Care – PPO | Source: Ambulatory Visit | Attending: Gastroenterology | Admitting: Gastroenterology

## 2022-05-08 ENCOUNTER — Ambulatory Visit: Payer: BC Managed Care – PPO | Admitting: Anesthesiology

## 2022-05-08 ENCOUNTER — Encounter
Admission: RE | Disposition: A | Discharge status: Home / Self Care | Payer: Self-pay | Source: Ambulatory Visit | Attending: Gastroenterology

## 2022-05-08 DIAGNOSIS — D125 Benign neoplasm of sigmoid colon: Secondary | ICD-10-CM | POA: Diagnosis not present

## 2022-05-08 DIAGNOSIS — K640 First degree hemorrhoids: Secondary | ICD-10-CM | POA: Insufficient documentation

## 2022-05-08 DIAGNOSIS — Z6841 Body Mass Index (BMI) 40.0 and over, adult: Secondary | ICD-10-CM | POA: Insufficient documentation

## 2022-05-08 DIAGNOSIS — Z1211 Encounter for screening for malignant neoplasm of colon: Secondary | ICD-10-CM | POA: Insufficient documentation

## 2022-05-08 DIAGNOSIS — J45909 Unspecified asthma, uncomplicated: Secondary | ICD-10-CM | POA: Diagnosis not present

## 2022-05-08 DIAGNOSIS — I10 Essential (primary) hypertension: Secondary | ICD-10-CM | POA: Insufficient documentation

## 2022-05-08 HISTORY — PX: COLONOSCOPY WITH PROPOFOL: SHX5780

## 2022-05-08 LAB — GLUCOSE, CAPILLARY: Glucose-Capillary: 180 mg/dL — ABNORMAL HIGH (ref 70–99)

## 2022-05-08 SURGERY — COLONOSCOPY WITH PROPOFOL
Anesthesia: General

## 2022-05-08 MED ORDER — PROPOFOL 10 MG/ML IV BOLUS
INTRAVENOUS | Status: DC | PRN
Start: 2022-05-08 — End: 2022-05-08
  Administered 2022-05-08: 120 mg via INTRAVENOUS

## 2022-05-08 MED ORDER — PROPOFOL 500 MG/50ML IV EMUL
INTRAVENOUS | Status: DC | PRN
Start: 2022-05-08 — End: 2022-05-08
  Administered 2022-05-08: 150 ug/kg/min via INTRAVENOUS

## 2022-05-08 MED ORDER — SODIUM CHLORIDE 0.9 % IV SOLN
INTRAVENOUS | Status: DC
  Administered 2022-05-08: 1000 mL via INTRAVENOUS

## 2022-05-08 MED ORDER — PROPOFOL 10 MG/ML IV BOLUS
INTRAVENOUS | Status: AC
  Filled 2022-05-08: qty 20

## 2022-05-08 MED ORDER — LIDOCAINE HCL (PF) 1 % IJ SOLN
INTRAMUSCULAR | Status: AC
  Filled 2022-05-08: qty 2

## 2022-05-08 MED ORDER — PROPOFOL 10 MG/ML IV BOLUS
INTRAVENOUS | Status: AC
  Filled 2022-05-08: qty 40

## 2022-05-08 MED ORDER — LIDOCAINE HCL (CARDIAC) PF 100 MG/5ML IV SOSY
PREFILLED_SYRINGE | INTRAVENOUS | Status: DC | PRN
  Administered 2022-05-08: 50 mg via INTRAVENOUS

## 2022-05-08 NOTE — Progress Notes (Signed)
Gas pressure noted on arrival to GI recovery. Passing flatus, feeling better

## 2022-05-08 NOTE — Anesthesia Preprocedure Evaluation (Signed)
Anesthesia Evaluation  Patient identified by MRN, date of birth, ID band Patient awake    Reviewed: Allergy & Precautions, NPO status , Patient's Chart, lab work & pertinent test results  Airway Mallampati: II  TM Distance: >3 FB Neck ROM: Full    Dental  (+) Teeth Intact   Pulmonary neg pulmonary ROS, asthma    Pulmonary exam normal breath sounds clear to auscultation       Cardiovascular Exercise Tolerance: Good hypertension, negative cardio ROS Normal cardiovascular exam Rhythm:Regular Rate:Normal     Neuro/Psych negative neurological ROS  negative psych ROS   GI/Hepatic negative GI ROS, Neg liver ROS,,,  Endo/Other  negative endocrine ROS  Morbid obesity  Renal/GU negative Renal ROS  negative genitourinary   Musculoskeletal   Abdominal  (+) + obese  Peds negative pediatric ROS (+)  Hematology negative hematology ROS (+)   Anesthesia Other Findings Past Medical History: No date: Asthma  Past Surgical History: No date: HERNIA REPAIR  BMI    Body Mass Index: 41.58 kg/m      Reproductive/Obstetrics negative OB ROS                             Anesthesia Physical Anesthesia Plan  ASA: 3  Anesthesia Plan: General   Post-op Pain Management:    Induction: Intravenous  PONV Risk Score and Plan: Propofol infusion and TIVA  Airway Management Planned: Natural Airway  Additional Equipment:   Intra-op Plan:   Post-operative Plan:   Informed Consent: I have reviewed the patients History and Physical, chart, labs and discussed the procedure including the risks, benefits and alternatives for the proposed anesthesia with the patient or authorized representative who has indicated his/her understanding and acceptance.     Dental Advisory Given  Plan Discussed with: CRNA and Surgeon  Anesthesia Plan Comments:        Anesthesia Quick Evaluation

## 2022-05-08 NOTE — Transfer of Care (Signed)
Immediate Anesthesia Transfer of Care Note  Patient: Mark [name]  Procedure(s) Performed: COLONOSCOPY WITH PROPOFOL  Patient Location: Endoscopy Unit  Anesthesia Type:General  Level of Consciousness: awake and oriented  Airway & Oxygen Therapy: Patient Spontanous Breathing  Post-op Assessment: Report given to RN and Post -op Vital signs reviewed and stable  Post vital signs: Reviewed and stable  Last Vitals:  Vitals Value Taken Time  BP 144/98 05/08/22 0938  Temp 36.6 C 05/08/22 0936  Pulse 91 05/08/22 0939  Resp 24 05/08/22 0939  SpO2 100 % 05/08/22 0939  Vitals shown include unvalidated device data.  Last Pain:  Vitals:   05/08/22 0936  TempSrc: Temporal  PainSc: Asleep         Complications: No notable events documented.

## 2022-05-08 NOTE — Op Note (Signed)
[facility] Gastroenterology Patient Name: Mark [name] Procedure Date: 05/08/2022 9:02 AM MRN: [id number] Account #: [id number] Date of Birth: Sep 26, 1975 Admit Type: Outpatient Age: 47 Room: [facility] ENDO ROOM 3 Gender: Male Note Status: Finalized Instrument Name: Jasper Riling [id number] Procedure:             Colonoscopy Indications:           Screening for colorectal malignant neoplasm Providers:             Andrey Farmer MD, MD Medicines:             Monitored Anesthesia Care Complications:         No immediate complications. Estimated blood loss:                         Minimal. Procedure:             Pre-Anesthesia Assessment:                        - Prior to the procedure, a History and Physical was                         performed, and patient medications and allergies were                         reviewed. The patient is competent. The risks and                         benefits of the procedure and the sedation options and                         risks were discussed with the patient. All questions                         were answered and informed consent was obtained.                         Patient identification and proposed procedure were                         verified by the physician, the nurse, the                         anesthesiologist, the anesthetist and the technician                         in the endoscopy suite. Mental Status Examination:                         alert and oriented. Airway Examination: normal                         oropharyngeal airway and neck mobility. Respiratory                         Examination: clear to auscultation. CV Examination:                         normal. Prophylactic Antibiotics: The patient does not  require prophylactic antibiotics. Prior                         Anticoagulants: The patient has taken no anticoagulant                         or antiplatelet agents. ASA Grade  Assessment: III - A                         patient with severe systemic disease. After reviewing                         the risks and benefits, the patient was deemed in                         satisfactory condition to undergo the procedure. The                         anesthesia plan was to use monitored anesthesia care                         (MAC). Immediately prior to administration of                         medications, the patient was re-assessed for adequacy                         to receive sedatives. The heart rate, respiratory                         rate, oxygen saturations, blood pressure, adequacy of                         pulmonary ventilation, and response to care were                         monitored throughout the procedure. The physical                         status of the patient was re-assessed after the                         procedure.                        After obtaining informed consent, the colonoscope was                         passed under direct vision. Throughout the procedure,                         the patient's blood pressure, pulse, and oxygen                         saturations were monitored continuously. The                         Colonoscope was introduced through the anus and  advanced to the the cecum, identified by appendiceal                         orifice and ileocecal valve. The colonoscopy was                         performed without difficulty. The patient tolerated                         the procedure well. The quality of the bowel                         preparation was good. The ileocecal valve, appendiceal                         orifice, and rectum were photographed. Findings:      The perianal and digital rectal examinations were normal.      A 10 mm polyp was found in the sigmoid colon. The polyp was       pedunculated. The polyp was removed with a hot snare. Resection and       retrieval were  complete. To prevent bleeding post-intervention, one       hemostatic clip was successfully placed. There was no bleeding at the       end of the procedure.      Internal hemorrhoids were found during retroflexion. The hemorrhoids       were Grade I (internal hemorrhoids that do not prolapse).      The exam was otherwise without abnormality on direct and retroflexion       views. Impression:            - One 10 mm polyp in the sigmoid colon, removed with a                         hot snare. Resected and retrieved. Clip was placed.                        - Internal hemorrhoids.                        - The examination was otherwise normal on direct and                         retroflexion views. Recommendation:        - Discharge patient to home.                        - Resume previous diet.                        - Continue present medications.                        - Await pathology results.                        - Repeat colonoscopy in 3 years for surveillance.                        - Return to referring physician as previously  scheduled. Procedure Code(s):     --- Professional ---                        [phone], Colonoscopy, flexible; with removal of                         tumor(s), polyp(s), or other lesion(s) by snare                         technique Diagnosis Code(s):     --- Professional ---                        Z12.11, Encounter for screening for malignant neoplasm                         of colon                        D12.5, Benign neoplasm of sigmoid colon                        K64.0, First degree hemorrhoids CPT copyright 2022 American [employer] Association. All rights reserved. The codes documented in this report are preliminary and upon coder review may  be revised to meet current compliance requirements. Andrey Farmer MD, MD 05/08/2022 9:39:10 AM Number of Addenda: 0 Note Initiated On: 05/08/2022 9:02 AM Scope Withdrawal Time: 0 hours 10  minutes 54 seconds  Total Procedure Duration: 0 hours 13 minutes 42 seconds  Estimated Blood Loss:  Estimated blood loss was minimal.      [facility]

## 2022-05-08 NOTE — Interval H&P Note (Signed)
History and Physical Interval Note:  05/08/2022 9:08 AM  [name] Overall  has presented today for surgery, with the diagnosis of colon cancer screening.  The various methods of treatment have been discussed with the patient and family. After consideration of risks, benefits and other options for treatment, the patient has consented to  Procedure(s): COLONOSCOPY WITH PROPOFOL (N/A) as a surgical intervention.  [name] history has been reviewed, patient examined, no change in status, stable for surgery.  I have reviewed the patient's chart and labs.  Questions were answered to the patient's satisfaction.     Mark [name]  Ok to proceed with colonoscopy

## 2022-05-08 NOTE — H&P (Signed)
Outpatient short stay form Pre-procedure 05/08/2022  Lesly Rubenstein, MD  Primary Physician: [facility]  Reason for visit:  Screening  History of present illness:    47 y/o gentleman with history of obesity and hypertension here for index screening. No blood thinners. No family history of GI malignancies. No significant abdominal surgeries.    Current Facility-Administered Medications:    0.9 %  sodium chloride infusion, , Intravenous, Continuous, Nandan Willems, Hilton Cork, MD, Last Rate: 20 mL/hr at 05/08/22 0859, 1,000 mL at 05/08/22 0859   lidocaine (PF) (XYLOCAINE) 1 % injection, , , ,   Medications Prior to Admission  Medication Sig Dispense Refill Last Dose   albuterol (PROVENTIL HFA;VENTOLIN HFA) 108 (90 Base) MCG/ACT inhaler Inhale 2 puffs into the lungs every 6 (six) hours as needed for wheezing or shortness of breath. 1 Inhaler 0 05/08/2022   amLODipine (NORVASC) 2.5 MG tablet Take 2.5 mg by mouth daily.   05/08/2022 at 630   ciprofloxacin-dexamethasone (CIPRODEX) otic suspension Place 4 drops into both ears 2 (two) times daily. (Patient not taking: Reported on 05/08/2022) 7.5 mL 0 Not Taking   methylPREDNISolone (MEDROL DOSEPAK) 4 MG TBPK tablet Take as directed (Patient not taking: Reported on 05/08/2022) 21 tablet 0 Not Taking   montelukast (SINGULAIR) 10 MG tablet Take 10 mg by mouth at bedtime. (Patient not taking: Reported on 05/08/2022)   Not Taking     No Known Allergies   Past Medical History:  Diagnosis Date   Asthma     Review of systems:  Otherwise negative.    Physical Exam  Gen: Alert, oriented. Appears stated age.  HEENT: PERRLA. Lungs: No respiratory distress CV: RRR Abd: soft, benign, no masses Ext: No edema    Planned procedures: Proceed with colonoscopy. The patient understands the nature of the planned procedure, indications, risks, alternatives and potential complications including but not limited to bleeding, infection,  perforation, damage to internal organs and possible oversedation/side effects from anesthesia. The patient agrees and gives consent to proceed.  Please refer to procedure notes for findings, recommendations and patient disposition/instructions.     Lesly Rubenstein, MD [facility] Gastroenterology

## 2022-05-08 NOTE — Anesthesia Postprocedure Evaluation (Signed)
Anesthesia Post Note  Patient: Mark [name]  Procedure(s) Performed: COLONOSCOPY WITH PROPOFOL  Patient location during evaluation: PACU Anesthesia Type: General Level of consciousness: awake and awake and alert Vital Signs Assessment: post-procedure vital signs reviewed and stable Respiratory status: spontaneous breathing and respiratory function stable Cardiovascular status: blood pressure returned to baseline Anesthetic complications: no   No notable events documented.   Last Vitals:  Vitals:   05/08/22 0938 05/08/22 0946  BP: (!) 144/98 (!) 144/100  Pulse:  83  Resp: (!) 21 18  Temp:    SpO2:  100%    Last Pain:  Vitals:   05/08/22 0946  TempSrc:   PainSc: 0-No pain                 VAN STAVEREN,Jupiter Kabir

## 2022-05-11 ENCOUNTER — Encounter: Payer: Self-pay | Admitting: Gastroenterology

## 2022-05-11 LAB — SURGICAL PATHOLOGY

## 2022-07-12 ENCOUNTER — Emergency Department: Payer: Self-pay

## 2022-07-12 ENCOUNTER — Other Ambulatory Visit: Payer: Self-pay

## 2022-07-12 ENCOUNTER — Emergency Department
Admission: EM | Admit: 2022-07-12 | Discharge: 2022-07-12 | Disposition: A | Discharge status: Home / Self Care | Payer: Self-pay | Attending: Student in an Organized Health Care Education/Training Program | Admitting: Student in an Organized Health Care Education/Training Program

## 2022-07-12 DIAGNOSIS — M75101 Unspecified rotator cuff tear or rupture of right shoulder, not specified as traumatic: Secondary | ICD-10-CM | POA: Insufficient documentation

## 2022-07-12 MED ORDER — HYDROCODONE-ACETAMINOPHEN 5-325 MG PO TABS: 1.0000 | ORAL_TABLET | ORAL | 0 refills | Status: DC | PRN

## 2022-07-12 MED ORDER — HYDROCODONE-ACETAMINOPHEN 5-325 MG PO TABS
1.0000 | ORAL_TABLET | ORAL | Status: AC
  Administered 2022-07-12: 1 via ORAL
  Filled 2022-07-12: qty 1

## 2022-07-12 MED ORDER — KETOROLAC TROMETHAMINE 30 MG/ML IJ SOLN
30.0000 mg | Freq: Once | INTRAMUSCULAR | Status: AC
  Administered 2022-07-12: 30 mg via INTRAMUSCULAR
  Filled 2022-07-12: qty 1

## 2022-07-12 MED ORDER — CELECOXIB 200 MG PO CAPS: 200.0000 mg | ORAL_CAPSULE | Freq: Two times a day (BID) | ORAL | 0 refills | Status: AC

## 2022-07-12 NOTE — ED Provider Notes (Signed)
[place] EMERGENCY DEPARTMENT AT [facility] Provider Note   CSN: [id number] Arrival date & time: 07/12/22  1641     History  Chief Complaint  Patient presents with   Shoulder Pain    RIGHT x3 days    Mark [name] is a 47 y.o. male.  Presents to the emergency department for evaluation of right shoulder pain.  Patient has had right shoulder pain for 3 days.  Patient states he reached for a patient that he thought was falling and may have injured his shoulder 3 days ago.  No neck pain numbness tingling radicular symptoms.  He has been taking a little bit over-the-counter medications with little relief.  He has pain with active abduction and flexion greater than 90 degrees.  He denies any chest pain shortness of breath or back pain.  Pain is located along the anterior proximal shoulder.  Pain is focal  HPI     Home Medications Prior to Admission medications   Medication Sig Start Date End Date Taking? Authorizing Provider  celecoxib (CELEBREX) 200 MG capsule Take 1 capsule (200 mg total) by mouth 2 (two) times daily for 7 days. 07/12/22 07/19/22 Yes Evon Slack, PA-C  HYDROcodone-acetaminophen (NORCO) 5-325 MG tablet Take 1 tablet by mouth every 4 (four) hours as needed for moderate pain. 07/12/22  Yes Evon Slack, PA-C  albuterol (PROVENTIL HFA;VENTOLIN HFA) 108 (90 Base) MCG/ACT inhaler Inhale 2 puffs into the lungs every 6 (six) hours as needed for wheezing or shortness of breath. 02/15/16   Governor Rooks, MD  amLODipine (NORVASC) 2.5 MG tablet Take 2.5 mg by mouth daily.    [provider]  ciprofloxacin-dexamethasone (CIPRODEX) otic suspension Place 4 drops into both ears 2 (two) times daily. Patient not taking: Reported on 05/08/2022 06/08/16   Lutricia Feil, PA-C  methylPREDNISolone (MEDROL DOSEPAK) 4 MG TBPK tablet Take as directed Patient not taking: Reported on 05/08/2022 03/10/21   Nita Sickle, MD  montelukast (SINGULAIR) 10 MG tablet Take 10  mg by mouth at bedtime. Patient not taking: Reported on 05/08/2022    [provider]      Allergies    Patient has no known allergies.    Review of Systems   Review of Systems  Physical Exam Updated Vital Signs BP (!) 143/90   Pulse 96   Temp 98 F (36.7 C) (Oral)   Resp 16   Ht 5\' 9"  (1.753 m)   Wt 131.5 kg   SpO2 94%   BMI 42.83 kg/m  Physical Exam Constitutional:      Appearance: He is well-developed.  HENT:     Head: Normocephalic and atraumatic.  Eyes:     Conjunctiva/sclera: Conjunctivae normal.  Cardiovascular:     Rate and Rhythm: Normal rate.  Pulmonary:     Effort: Pulmonary effort is normal. No respiratory distress.  Musculoskeletal:        General: Normal range of motion.     Cervical back: Normal range of motion.     Comments: Right upper Extremity: Examination of the right shoulder and arm showed no bony abnormality or edema.  Patient has limited active abduction and flexion past 90 degrees but is good internal ex rotation with no discomfort.  The patient has no pain with shoulder motion.  The patient has a positive Hawkins test and a positive impingement test.  The patient has a negative drop arm test. The patient has a negative yergasons and speeds test.  The patient is  non-tender along the deltoid muscle.  There is no subacromial space tenderness with no AC joint tenderness.  The patient has no instability of the shoulder with anterior-posterior motion.  There is a negative sulcus sign.  The rotator cuff muscle strength is 5/5 with supraspinatus, 5/5 with internal rotation, and 5/5 with external rotation.  There is no crepitus with range of motion activities.    Neurological: The patient has sensation that is intact to light touch and pinprick bilaterally.  The patient has normal grip strength.  The patient has full biceps, wrist extension, grip, and interosseous strength.  The patient has 2 + DTRs bilaterally.  Vascular: The patient has less than  2 second capillary refill.  The patient has normal ulnar and radial pulses.  The patient has normal warmth to touch.     Skin:    General: Skin is warm.     Findings: No rash.  Neurological:     Mental Status: He is alert and oriented to person, place, and time.  Psychiatric:        Behavior: Behavior normal.        Thought Content: Thought content normal.     ED Results / Procedures / Treatments   Labs (all labs ordered are listed, but only abnormal results are displayed) Labs Reviewed - No data to display  EKG None  Radiology DG Shoulder Right  Result Date: 07/12/2022 CLINICAL DATA:  144615 Pain 144615 EXAM: RIGHT SHOULDER - 2+ VIEW COMPARISON:  None Available. FINDINGS: There is no evidence of fracture or dislocation. There is no evidence of arthropathy or other focal bone abnormality. Soft tissues are unremarkable. IMPRESSION: Negative. Electronically Signed   By: Tish Frederickson M.D.   On: 07/12/2022 17:45    Procedures Procedures    Medications Ordered in ED Medications  ketorolac (TORADOL) 30 MG/ML injection 30 mg (30 mg Intramuscular Given 07/12/22 1753)  HYDROcodone-acetaminophen (NORCO/VICODIN) 5-325 MG per tablet 1 tablet (1 tablet Oral Given 07/12/22 1752)    ED Course/ Medical Decision Making/ A&P                             Medical Decision Making Risk Prescription drug management.   47 year old male with right shoulder pain 3 days ago.  He was reaching and felt some pain throughout the shoulder.  Now he has positive impingement signs and signs and symptoms consistent with rotator cuff syndrome.  X-rays ordered and independently reviewed by me today show no evidence of acute bony abnormality, abnormal bony lesions, dislocation or fracture.  He is given Toradol IM and Norco.  He is given a prescription for Celebrex to take twice daily x 7 days.  His BUN and creatinine within normal limits.  He will call orthopedics to schedule follow-up appointment if no  improvement.  Patient's vital signs are stable, afebrile.  He is stable and ready for discharge Final Clinical Impression(s) / ED Diagnoses Final diagnoses:  Rotator cuff syndrome, right    Rx / DC Orders ED Discharge Orders          Ordered    celecoxib (CELEBREX) 200 MG capsule  2 times daily        07/12/22 1802    HYDROcodone-acetaminophen (NORCO) 5-325 MG tablet  Every 4 hours PRN        07/12/22 1802              Evon Slack, PA-C 07/12/22 1806  Willy Eddy, MD 07/12/22 Ebony Cargo

## 2022-07-12 NOTE — ED Triage Notes (Signed)
Pt to ED from home POV for right shoulder pain. 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 Pt denies fall or injury. Pt shoulder is sore to the touch. Pt is CAOx4, in no acute distress and ambulatory in triage.

## 2022-07-12 NOTE — Discharge Instructions (Addendum)
Please rest ice the right shoulder.  You may take Tylenol every 6 hours as needed for pain.  Take Celebrex as prescribed for 1 week.  Call orthopedic provider to schedule follow-up appointment.  Return to the ER for any worsening symptoms or any urgent changes in your health.

## 2022-10-06 ENCOUNTER — Other Ambulatory Visit: Payer: Self-pay

## 2022-10-06 ENCOUNTER — Emergency Department
Admission: EM | Admit: 2022-10-06 | Discharge: 2022-10-06 | Disposition: A | Discharge status: Home / Self Care | Payer: Self-pay | Attending: Emergency Medicine | Admitting: Emergency Medicine

## 2022-10-06 DIAGNOSIS — J069 Acute upper respiratory infection, unspecified: Secondary | ICD-10-CM | POA: Insufficient documentation

## 2022-10-06 DIAGNOSIS — H6692 Otitis media, unspecified, left ear: Secondary | ICD-10-CM | POA: Insufficient documentation

## 2022-10-06 DIAGNOSIS — H6690 Otitis media, unspecified, unspecified ear: Secondary | ICD-10-CM

## 2022-10-06 MED ORDER — AMOXICILLIN 500 MG PO CAPS
1000.0000 mg | ORAL_CAPSULE | Freq: Once | ORAL | Status: AC
  Administered 2022-10-06: 1000 mg via ORAL
  Filled 2022-10-06: qty 2

## 2022-10-06 MED ORDER — AMOXICILLIN 500 MG PO CAPS: 1000.0000 mg | ORAL_CAPSULE | Freq: Two times a day (BID) | ORAL | 0 refills | Status: AC

## 2022-10-06 NOTE — ED Triage Notes (Signed)
Pt states he has been getting over recent respiratory infection, pt states he developed left ear pain last night. Pt states it feels like something is stuck in his ear.

## 2022-10-06 NOTE — ED Notes (Signed)
1230p--patient called and says he now has some bloody exudate from ear.  Says it feels better now.  Advised that he still need to take the amoxil and call pcp for follow up.

## 2022-10-06 NOTE — Discharge Instructions (Signed)
Take antibiotics for the full course as prescribed.  Find DayQuil or similar cough/cold medicine at your local pharmacy and take as prescribed.  Thank you for choosing [place] for your health care today!  Please see your primary doctor this week for a follow up appointment.   If you have any new, worsening, or unexpected symptoms call your doctor right away or come back to the emergency department for reevaluation.  It was my pleasure to care for you today.   Daneil Dan Modesto Charon, MD

## 2022-10-06 NOTE — ED Provider Notes (Signed)
[facility] Provider Note    Event Date/Time   First MD Initiated Contact with Patient 10/06/22 [phone]     (approximate)   History   Otalgia   HPI  Mark [name] is a 47 y.o. male   Past medical history of no significant past medical history presents emergency department with left ear pain.  Started today.  This is in the context of several days of upper respiratory viral infectious symptoms like cough, congestion, myalgias.  No other acute medical complaints.  No fevers       Physical Exam   Triage Vital Signs: ED Triage Vitals  Encounter Vitals Group     BP 10/06/22 0204 (!) 143/97     Systolic BP Percentile --      Diastolic BP Percentile --      Pulse Rate 10/06/22 0204 93     Resp 10/06/22 0204 18     Temp 10/06/22 0203 98.2 F (36.8 C)     Temp Source 10/06/22 0203 Oral     SpO2 10/06/22 0204 96 %     Weight 10/06/22 0203 267 lb (121.1 kg)     Height 10/06/22 0203 5\' 9"  (1.753 m)     Head Circumference --      Peak Flow --      Pain Score 10/06/22 0203 9     Pain Loc --      Pain Education --      Exclude from Growth Chart --     Most recent vital signs: Vitals:   10/06/22 0203 10/06/22 0204  BP:  (!) 143/97  Pulse:  93  Resp:  18  Temp: 98.2 F (36.8 C)   SpO2:  96%    General: Awake, no distress.  CV:  Good peripheral perfusion.  Resp:  Normal effort.  Abd:  No distention.  Other:  Well-appearing no acute distress nontoxic afebrile.  He has an effusion to the left ear, no evidence of mastoiditis   ED Results / Procedures / Treatments   Labs (all labs ordered are listed, but only abnormal results are displayed) Labs Reviewed - No data to display  PROCEDURES:  Critical Care performed: No  Procedures   MEDICATIONS ORDERED IN ED: Medications  amoxicillin (AMOXIL) capsule 1,000 mg (has no administration in time range)    IMPRESSION / MDM / ASSESSMENT AND PLAN / ED COURSE  I reviewed the triage vital signs  and the nursing notes.                                Patient's presentation is most consistent with acute presentation with potential threat to life or bodily function.  Differential diagnosis includes, but is not limited to, acute otitis media, viral URI, bacterial pneumonia, sepsis, meningitis, mastoiditis  MDM:    He has left ear pain and effusion concerning for acute otitis media in the setting of viral URI symptoms/congestion.  Appears overall well nontoxic doubt sepsis, meningitis, no evidence of mastoiditis.  Will start amoxicillin. DC       FINAL CLINICAL IMPRESSION(S) / ED DIAGNOSES   Final diagnoses:  Acute otitis media, unspecified otitis media type  Viral upper respiratory tract infection     Rx / DC Orders   ED Discharge Orders          Ordered    amoxicillin (AMOXIL) 500 MG capsule  2 times daily  10/06/22 0423             Note:  This document was prepared using Dragon voice recognition software and may include unintentional dictation errors.    Pilar Jarvis, MD 10/06/22 [phone]

## 2022-12-29 ENCOUNTER — Emergency Department
Admission: EM | Admit: 2022-12-29 | Discharge: 2022-12-29 | Disposition: A | Discharge status: Home / Self Care | Payer: Self-pay | Attending: Emergency Medicine | Admitting: Emergency Medicine

## 2022-12-29 ENCOUNTER — Encounter: Payer: Self-pay | Admitting: Emergency Medicine

## 2022-12-29 ENCOUNTER — Emergency Department: Payer: Self-pay

## 2022-12-29 DIAGNOSIS — J45909 Unspecified asthma, uncomplicated: Secondary | ICD-10-CM | POA: Insufficient documentation

## 2022-12-29 DIAGNOSIS — E119 Type 2 diabetes mellitus without complications: Secondary | ICD-10-CM | POA: Insufficient documentation

## 2022-12-29 DIAGNOSIS — I10 Essential (primary) hypertension: Secondary | ICD-10-CM | POA: Insufficient documentation

## 2022-12-29 DIAGNOSIS — M79644 Pain in right finger(s): Secondary | ICD-10-CM | POA: Insufficient documentation

## 2022-12-29 DIAGNOSIS — Z79899 Other long term (current) drug therapy: Secondary | ICD-10-CM | POA: Insufficient documentation

## 2022-12-29 MED ORDER — ONDANSETRON 4 MG PO TBDP: 4.0000 mg | ORAL_TABLET | Freq: Four times a day (QID) | ORAL | 0 refills | Status: AC | PRN

## 2022-12-29 MED ORDER — CEPHALEXIN 500 MG PO CAPS: 500.0000 mg | ORAL_CAPSULE | Freq: Four times a day (QID) | ORAL | 0 refills | Status: AC

## 2022-12-29 MED ORDER — HYDROCODONE-ACETAMINOPHEN 5-325 MG PO TABS: 2.0000 | ORAL_TABLET | Freq: Three times a day (TID) | ORAL | 0 refills | Status: AC | PRN

## 2022-12-29 MED ORDER — IBUPROFEN 800 MG PO TABS: 800.0000 mg | ORAL_TABLET | Freq: Three times a day (TID) | ORAL | 0 refills | Status: AC | PRN

## 2022-12-29 MED ORDER — IBUPROFEN 800 MG PO TABS
800.0000 mg | ORAL_TABLET | Freq: Once | ORAL | Status: AC
  Administered 2022-12-29: 800 mg via ORAL
  Filled 2022-12-29: qty 1

## 2022-12-29 MED ORDER — CEPHALEXIN 500 MG PO CAPS
500.0000 mg | ORAL_CAPSULE | Freq: Once | ORAL | Status: AC
  Administered 2022-12-29: 500 mg via ORAL
  Filled 2022-12-29: qty 1

## 2022-12-29 MED ORDER — HYDROCODONE-ACETAMINOPHEN 5-325 MG PO TABS
2.0000 | ORAL_TABLET | Freq: Once | ORAL | Status: AC
  Administered 2022-12-29: 2 via ORAL
  Filled 2022-12-29: qty 2

## 2022-12-29 MED ORDER — ONDANSETRON 4 MG PO TBDP
4.0000 mg | ORAL_TABLET | Freq: Once | ORAL | Status: AC
  Administered 2022-12-29: 4 mg via ORAL
  Filled 2022-12-29: qty 1

## 2022-12-29 NOTE — ED Provider Notes (Signed)
[facility] Provider Note    Event Date/Time   First MD Initiated Contact with Patient 12/29/22 2304     (approximate)   History   Hand Injury   HPI  Mark [name] is a 47 y.o. male right-hand-dominant male with history of hypertension, diabetes, asthma who presents to the emergency department with right thumb pain.  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  He states he became concerned today when the thumb seem to be warmer than the rest of his hand but no appreciable redness.  No fever.  He states the warmth has resolved.  It is very painful to flex to the DIP.  No history of gout.   History provided by patient.    Past Medical History:  Diagnosis Date   Asthma     Past Surgical History:  Procedure Laterality Date   COLONOSCOPY WITH PROPOFOL N/A 05/08/2022   Procedure: COLONOSCOPY WITH PROPOFOL;  Surgeon: Regis Bill, MD;  Location: [facility] ENDOSCOPY;  Service: Endoscopy;  Laterality: N/A;   HERNIA REPAIR      MEDICATIONS:  Prior to Admission medications   Medication Sig Start Date End Date Taking? Authorizing Provider  albuterol (PROVENTIL HFA;VENTOLIN HFA) 108 (90 Base) MCG/ACT inhaler Inhale 2 puffs into the lungs every 6 (six) hours as needed for wheezing or shortness of breath. 02/15/16   Governor Rooks, MD  amLODipine (NORVASC) 2.5 MG tablet Take 2.5 mg by mouth daily.    [provider]  ciprofloxacin-dexamethasone (CIPRODEX) otic suspension Place 4 drops into both ears 2 (two) times daily. Patient not taking: Reported on 05/08/2022 06/08/16   Lutricia Feil, PA-C  HYDROcodone-acetaminophen [facility]) 5-325 MG tablet Take 1 tablet by mouth every 4 (four) hours as needed for moderate pain. 07/12/22   Evon Slack, PA-C  methylPREDNISolone (MEDROL DOSEPAK) 4 MG TBPK tablet Take as directed Patient not taking: Reported on  05/08/2022 03/10/21   Nita Sickle, MD  montelukast (SINGULAIR) 10 MG tablet Take 10 mg by mouth at bedtime. Patient not taking: Reported on 05/08/2022    [provider]    Physical Exam   Triage Vital Signs: ED Triage Vitals  Encounter Vitals Group     BP 12/29/22 2151 (!) 158/100     Systolic BP Percentile --      Diastolic BP Percentile --      Pulse Rate 12/29/22 2151 78     Resp 12/29/22 2151 18     Temp 12/29/22 2151 97.7 F (36.5 C)     Temp Source 12/29/22 2151 Oral     SpO2 12/29/22 2151 97 %     Weight --      Height --      Head Circumference --      Peak Flow --      Pain Score 12/29/22 2150 10     Pain Loc --      Pain Education --      Exclude from Growth Chart --     Most recent vital signs: Vitals:   12/29/22 2151 12/29/22 2342  BP: (!) 158/100 (!) 150/101  Pulse: 78 82  Resp: 18 18  Temp: 97.7 F (36.5 C)   SpO2: 97% 100%     CONSTITUTIONAL: Alert and responds appropriately to questions. Well-appearing; well-nourished HEAD: Normocephalic, atraumatic EYES: Conjunctivae clear, pupils appear equal ENT: normal nose;  moist mucous membranes NECK: Normal range of motion CARD: Regular rate and rhythm RESP: Normal chest excursion without splinting or tachypnea; no hypoxia or respiratory distress, speaking full sentences ABD/GI: non-distended EXT: Patient is tender to palpation over the DIP of the right thumb.  There is mild swelling to the right thumb diffusely compared to the left.  No increased warmth and no redness noted.  There is no significant tenderness over the flexor tendon.  No fluctuance.  No sign of herpetic whitlow, felon, paronychia.  2+ right radial pulse and compartments in the right arm are soft.  He has pain with flexion of the DIP but is able to flex normally at the MCP of the first finger.  Normal range of motion in all other digits. SKIN: Normal color for age and race, no rashes on exposed skin NEURO: Moves all extremities  equally, normal speech, no facial asymmetry noted PSYCH: [name] mood and manner are appropriate. Grooming and personal hygiene are appropriate.  ED Results / Procedures / Treatments   LABS: (all labs ordered are listed, but only abnormal results are displayed) Labs Reviewed - No data to display   EKG:  EKG Interpretation Date/Time:    Ventricular Rate:    PR Interval:    QRS Duration:    QT Interval:    QTC Calculation:   R Axis:      Text Interpretation:            RADIOLOGY: My personal review and interpretation of imaging: No fracture.  I have personally reviewed all radiology reports. DG Hand Complete Right  Result Date: 12/30/2022 CLINICAL DATA:  Atraumatic right hand soreness and soft tissue swelling. EXAM: RIGHT HAND - COMPLETE 3+ VIEW COMPARISON:  None Available. FINDINGS: There is no evidence of fracture or dislocation. There is no evidence of arthropathy or other focal bone abnormality. Soft tissues are unremarkable. IMPRESSION: Negative. Electronically Signed   By: Aram Candela M.D.   On: 12/30/2022 00:26     PROCEDURES:  Critical Care performed: No     Procedures    IMPRESSION / MDM / ASSESSMENT AND PLAN / ED COURSE  I reviewed the triage vital signs and the nursing notes.   Patient here with right thumb pain.     DIFFERENTIAL DIAGNOSIS (includes but not limited to):   Tendinitis, sprain, gout, possible cellulitis, doubt fracture, low suspicion for flexor tenosynovitis given no tenderness over the flexor tendon, no signs of developing abscess  Patient's presentation is most consistent with acute complicated illness / injury requiring diagnostic workup.  PLAN: X-ray obtained from triage reviewed and interpreted by myself and shows no bony abnormality.  Patient has no increased warmth or redness currently but states that it was warmer to touch earlier today.  Will start him on antibiotics for potential developing infection but there  is no sign of developing abscess on exam and clinically no signs of flexor tenosynovitis.  We discussed that this could be tendinitis or sprain secondary to his line of work and also could be gout.  We discussed that treatment would be similar with rest, elevation, ice and anti-inflammatories.  Will give orthopedic follow-up if symptoms or not improving.  Patient is comfortable with this plan.  Will discharge with short course of narcotic pain medication as well.   MEDICATIONS GIVEN IN ED: Medications  cephALEXin (KEFLEX) capsule 500 mg (500 mg Oral Given 12/29/22 2340)  ibuprofen (ADVIL) tablet 800 mg (800 mg Oral Given 12/29/22 2339)  HYDROcodone-acetaminophen (NORCO/VICODIN) 5-325  MG per tablet 2 tablet (2 tablets Oral Given 12/29/22 2340)  ondansetron (ZOFRAN-ODT) disintegrating tablet 4 mg (4 mg Oral Given 12/29/22 2339)     ED COURSE:  At this time, I do not feel there is any life-threatening condition present. I reviewed all nursing notes, vitals, pertinent previous records.  All lab and urine results, EKGs, imaging ordered have been independently reviewed and interpreted by myself.  I reviewed all available radiology reports from any imaging ordered this visit.  Based on my assessment, I feel the patient is safe to be discharged home without further emergent workup and can continue workup as an outpatient as needed. Discussed all findings, treatment plan as well as usual and customary return precautions.  They verbalize understanding and are comfortable with this plan.  Outpatient follow-up has been provided as needed.  All questions have been answered.    CONSULTS:  none   OUTSIDE RECORDS REVIEWED: Reviewed last PCP note on 08/25/2022.     FINAL CLINICAL IMPRESSION(S) / ED DIAGNOSES   Final diagnoses:  Pain of right thumb     Rx / DC Orders   ED Discharge Orders          Ordered    cephALEXin (KEFLEX) 500 MG capsule  4 times daily        12/29/22 2332    ibuprofen (ADVIL)  800 MG tablet  Every 8 hours PRN        12/29/22 2332    ondansetron (ZOFRAN-ODT) 4 MG disintegrating tablet  Every 6 hours PRN        12/29/22 2332    HYDROcodone-acetaminophen (NORCO/VICODIN) 5-325 MG tablet  Every 8 hours PRN        12/29/22 2332             Note:  This document was prepared using Dragon voice recognition software and may include unintentional dictation errors.   Mettie Roylance, Layla Maw, DO 12/30/22 [phone]

## 2022-12-29 NOTE — ED Triage Notes (Addendum)
R hand problem. Sunday returned from trip and noticed right hand soreness. This morning woke up and had left thumb swelling and unable to move thumb. Pain is 10/10. Feels like burning. He has no ROM. Feels warm to touch. Denies bite or wound. Does not appear red. Denies hx of gout. Pulse strong and regular.

## 2022-12-29 NOTE — Discharge Instructions (Addendum)
Your x-ray showed no acute abnormality.  If radiology has other findings, I will contact you at home.  We are putting you on antibiotics in case this is developing infection given the increased warmth that you fell earlier tonight.  This may also be tendinitis or a sprain.  I recommend rest, elevation, ice, anti-inflammatories.  If symptoms not improving, please follow-up with orthopedics.  If you develop increased redness, warmth or fever of 100.4 or higher, please return to the emergency department.  You are being provided a prescription for opiates (also known as narcotics) for pain control.  Opiates can be addictive and should only be used when absolutely necessary for pain control when other alternatives do not work.  We recommend you only use them for the recommended amount of time and only as prescribed.  Please do not take with other sedative medications or alcohol.  Please do not drive, operate machinery, make important decisions while taking opiates.  Please note that these medications can be addictive and have high abuse potential.  Patients can become addicted to narcotics after only taking them for a few days.  Please keep these medications locked away from children, teenagers or any family members with history of substance abuse.  Narcotic pain medicine may also make you constipated.  You may use over-the-counter medications such as MiraLAX, Colace to prevent constipation.  If you become constipated, you may use over-the-counter enemas as needed.  Itching and nausea are also common side effects of narcotic pain medication.  If you develop uncontrolled vomiting or a rash, please stop these medications and seek medical care.
